# Patient Record
Sex: Female | Born: 1995 | Race: Black or African American | Hispanic: No | Marital: Single | Smoking: Former smoker
Health system: Southern US, Community
[De-identification: ages and names within clinical notes are randomized; demographics above are authoritative.]

## PROBLEM LIST (undated history)

## (undated) DIAGNOSIS — F99 Mental disorder, not otherwise specified: Secondary | ICD-10-CM

## (undated) HISTORY — PX: EYE SURGERY: SHX253

## (undated) HISTORY — DX: Mental disorder, not otherwise specified: F99

---
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
## 2014-01-03 ENCOUNTER — Ambulatory Visit: Payer: Self-pay | Admitting: Obstetrics and Gynecology

## 2014-01-05 ENCOUNTER — Encounter: Payer: Self-pay | Admitting: Obstetrics and Gynecology

## 2014-01-05 ENCOUNTER — Ambulatory Visit: Payer: Self-pay | Admitting: Obstetrics and Gynecology

## 2014-01-05 ENCOUNTER — Ambulatory Visit
Admit: 2014-01-05 | Discharge: 2014-01-05 | Disposition: A | Discharge status: Home / Self Care | Payer: Self-pay | Source: Ambulatory Visit | Attending: Obstetrics and Gynecology | Admitting: Obstetrics and Gynecology

## 2014-01-05 VITALS — BP 102/68 | HR 83 | Ht 60.75 in | Wt 135.6 lb

## 2014-01-05 DIAGNOSIS — N949 Unspecified condition associated with female genital organs and menstrual cycle: Secondary | ICD-10-CM

## 2014-01-05 DIAGNOSIS — Z01419 Encounter for gynecological examination (general) (routine) without abnormal findings: Secondary | ICD-10-CM

## 2014-01-05 MED ORDER — LEVONORGESTREL-ETHINYL ESTRAD 0.1-20 MG-MCG PO TABS *I*
1.0000 | ORAL_TABLET | Freq: Every day | ORAL | Status: DC
Start: 2014-01-05 — End: 2014-11-20

## 2014-01-05 NOTE — Patient Instructions (Signed)
Birth Control Options    About this topic   Birth control is also known as contraception. It is a way for men and women to avoid pregnancy. There are many kinds of birth control. It is important to learn about them to decide the best one for you to use.  General   Talk to your partner and your doctor. Together, you can choose the best kind of birth control for you. It is important to think about:  · Your health  · Protection from sexually-transmitted diseases (STDs)  · How often you have sex  · If you have more than one sex partner  · Your wish to have children in the future  There is only one way to fully avoid pregnancy. That is by not having sex. This is called abstinence. It also protects you from STDs.  What Is The Best Birth Control Method for Me?   Barrier methods: Use a block or barrier to keep sperm from reaching the egg. Condoms give you the most protection from STDs. Spermicides do not protect you from STDs.  · Female condom ? A thin film cover placed over the penis before sex. It is used only once.  · Female condom ? A lubricated thin pouch that is placed into the vagina before sex. It may be only used once.  · Spermicide ? A foam, cream, jelly, film, or tablet that kills sperm  · Diaphragm with spermicide ? A dome-shaped flexible disc and rim. It covers the cervix so the sperm cannot reach the egg. It is used with a spermicide.  · Sponge with spermicide ? A disc-shaped device with spermicide that covers your cervix. It is put in place before sex.  · Cervical cap with spermicide ? A soft latex or silicon cup with a round rim that fits over your cervix. It is put in place before sex.  Hormonal methods: These drugs give you hormones that keep the ovary from releasing eggs to be fertilized. This method does not protect you from STDs.  · Oral contraceptive pill ? A pill that you take each day that gives you hormones. It also thickens the cervical mucus to keep sperm from reaching the egg.  · Patch ? A  skin patch that contains hormones. It is worn on your lower belly, buttocks, or upper body.  · Vaginal ring ? A flexible ring that contains and gives you hormones. You put the ring into your vagina once a month. The ring stays in place for 3 weeks and then you remove it for 1 week.  · Shot or Injection ? A shot that gives you hormones over 3 months. It also thickens the cervical mucus that keeps the sperm from reaching the egg. You must repeat the shot every 3 months to have continued protection.  Emergency contraceptive: This may be used if you did not use birth control before sex or if your normal birth control failed. It should not be used as a regular form of birth control. This method does not protect you from STDs.  · Morning after pill ? One or two pills with hormones. This works the same as other birth control pills. You can use it after you have sex without a condom or without the use of any birth control. It is also used if your birth control did not work or you are forced to have sex.  Implanted devices: These are put into the body to stop eggs from being fertilized. They can   be kept in place for a few years. This method does not protect you from STDs.  · Intrauterine device (IUD) ? A T-shaped, flexible device that contains hormones. It is put into your uterus (womb) by the doctor.  · Implantable rod ? A matchstick-sized rod that contains hormones. It is put in under the skin of your upper arm.  Permanent methods: Surgery for people who do not want children in the future. Women will not be able to get pregnant. Men will not be able to get a woman pregnant. Permanent methods do not protect you from STDs.  · Sterilization surgery:  ¨ Women ? The fallopian tubes are closed off so the sperm cannot reach the egg. This is a tubal ligation.  ¨ Men ? The tube that carries the sperm is blocked. The fluid that comes out during ejaculation does not have sperm in it. This is a vasectomy.  · Sterilization implant for  women ? A small flexible metal device is put in the fallopian tube. A scar forms around it. This blocks the sperm from reaching the egg.  What will the results be?   Pregnancy and sexually transmitted diseases or STDs may be avoided. The option you choose will only work if you use it the right way and use it each time you have sex.  Where can I learn more?   Association of Reproductive Health Professionals  http://www.arhp.org/methodmatch/   FamilyDoctor.org  http://familydoctor.org/familydoctor/en/prevention-wellness/sex-birth-control/birth-control/birth-control-options.html   FDA Office of Women's Health  http://www.fda.gov/ForConsumers/ByAudience/ForWomen/WomensHealthTopics/ucm117971.htm   Last Reviewed Date   2013-09-27  Consumer Information Use and Disclaimer   This information is not specific medical advice and does not replace information you receive from your health care provider. This is only a brief summary of general information. It does NOT include all information about conditions, illnesses, injuries, tests, procedures, treatments, therapies, discharge instructions or life-style choices that may apply to you. You must talk with your health care provider for complete information about your health and treatment options. This information should not be used to decide whether or not to accept your health care provider’s advice, instructions or recommendations. Only your health care provider has the knowledge and training to provide advice that is right for you.  Copyright   Copyright © 2015 [employer] and its affiliates and/or licensors. All rights reserved.

## 2014-01-05 NOTE — Progress Notes (Signed)
Subjective:      Alison Robinson is a 18 y.o. premenopausal female who presents for an annual exam. The patient has no complaints today. She reports that her dysmenorrhea resolved after beginning regular exercises.     GYN History  LMP: 12/22/2013  Menses:flow is moderate  Cramps: Mild  Menarche: 18yo: [place]: 18yo   Last pap smear: Date: never  History of abnormal pap smear: N/A.   The patient is sexually active.   Sexually active: has sex with males, 3 total        Obstetric History     No data available       Screening History:    The patient wears seatbelts:yes  The patient participates in regular exercise: yes  Has the patient ever been transfused or tattooed?:yes  Lives with mom  Domestic violence:No    Alison Robinson  has a past medical history of Mental disorder.  Alison Robinson  does not have a problem list on file.  Alison Robinson  has no past surgical history on file.  Her family history is not on file.  Alison Robinson   Robinson currently has no medications in their medication list.  No current outpatient prescriptions on file prior to visit.     No current facility-administered medications on file prior to visit.     Alison Robinson has No Known Allergies (drug, envir, food or latex).  Review of Systems  Pertinent items are noted in HPI.     Objective:      Physical Examination  Constitutional:    Vital Signs: Height: 154.3 cm (5' 0.75") Weight: 61.508 kg (135 lb 9.6 oz) BP: 102/68 mmHg Heart Rate: 83     General Appearance:         [x]  Well-developed []  Other         []  Weight change from last year:_______Discussion_____         [x]  Well -nourished []  Other         []  Height  change from last year:_______Discussion_____          [x]  Normal Habitus []  Other         NECK:         Neck  [x]  Normal  []  Abnormal         Thyroid  [x]  Normal  []  Abnormal    Respiratory:         Respiratory Effort [x]  Normal  []  Abnormal         Ascultated Lungs [x]  Normal  []  Abnormal    Cardiovascular:         Ascultated Heart [x]  Normal  []   Abnormal           Sounds  [x]  Normal  []  Abnormal           Murmurs  [x]  Normal  []  Abnormal           Sounds  [x]  Normal  []  Abnormal           Peripheral Vascular [x]  Normal  []  Abnormal  Gastrointestinal:         Abdomen  [x]  Normal  []  Abnormal           Hernia  [x]  Normal  []  Abnormal           Liver  [x]  Normal  []  Abnormal           Spleen  [x]  Normal  []  Abnormal            Lymphatic:  Palpation of nodes:         --Neck  [x]  Normal  []  Abnormal           --Axilla  [x]  Normal  []  Abnormal           --Grion  [x]  Normal  []  Abnormal           --Other Site  [x]  Normal  []  Abnormal    Skin:         Inspected/Palpated [x]  Normal  []  Abnormal          Neurological/Psychiatric:         Orientation  [x]  Time      [x]  Place [x]  Person  []  Comments:            Mood and Affect [x]  Normal  []  Depressed  []  Anxious  []  Agitated []  Other  Gynecologic:         Breasts   [x]  Normal  []  Abnormal           External Genitalia  [x]  Normal  []  Abnormal           Urethral Meatus  [x]  Normal  []  Abnormal           Urethera   [x]  Normal  []  Abnormal           Bladder   [x]  Normal  []  Abnormal         Vagina/Pelvic Support  [x]  Normal  []  Abnormal           Cervix                   [x]  Normal  []  Abnormal           Uterus   [x]  Normal  []  Abnormal  Displaced to the far left of the pelvis.          Adnexa/Parametria  [x]  Normal  []  Abnormal  No obvious masses, tenderness or reasons for uterine displacement.          Anus/Perineum  [x]  Normal  []  Abnormal         Rectal   [x]  Normal  []  Abnormal           Uterine Prolapse  [x]  Normal  []  Abnormal         Cystocele   [x]  Normal  []  Abnormal         Rectocele   [x]  Normal  []  Abnormal       Assessment:      Alison Robinson is a 18 y.o. female who presents for an annual exam.     She has the following concerns: 1) STI TESTING      Plan:       Preventative health: Breast self exam technique reviewed and patient encouraged to perform self-exam monthly., Nutrition., Calcium.  and Multivitamins.  Nutrition/Exercise reviewed: Counseled on regular exercise. Discussed duration, frequency, intensity, and types of exercise.     STI Panel

## 2014-01-05 NOTE — Addendum Note (Signed)
Addended by: Candida PeelingPRATIVADI, Jamiah Homeyer S on: 01/05/2014 11:25 AM     Modules accepted: Orders

## 2014-01-05 NOTE — Addendum Note (Signed)
Addended by: Delories HeinzSALADYGA, Noorah Giammona on: 01/05/2014 11:27 AM     Modules accepted: Orders

## 2014-01-06 LAB — HEPATITIS B & C PROF
HBV Core Ab: NEGATIVE
HBV S Ab Quant: 8.28 m[IU]/mL
HBV S Ag: NEGATIVE
Hep C Ab: NEGATIVE

## 2014-01-06 LAB — SYPHILIS SCREEN
Syphilis Screen: NEGATIVE
Syphilis Status: NONREACTIVE

## 2014-01-06 LAB — VAGINITIS SCREEN: DNA PROBE: Vaginitis Screen:DNA Probe: 0

## 2014-01-06 LAB — HIV 1&2 ANTIGEN/ANTIBODY: HIV 1&2 ANTIGEN/ANTIBODY: NONREACTIVE

## 2014-01-08 LAB — N. GONORRHOEAE DNA AMPLIFICATION: N. gonorrhoeae DNA Amplification: 0

## 2014-01-08 LAB — CHLAMYDIA PLASMID DNA AMPLIFICATION: Chlamydia Plasmid DNA Amplification: 0

## 2014-01-09 LAB — HSV 1 ANTIBODY, IGG: HSV 1 IgG: 2.01 IV

## 2014-01-09 LAB — HSV 2 ANTIBODY, IGG: HSV 2 IgG: 0.16 IV

## 2014-04-23 ENCOUNTER — Encounter: Payer: Self-pay | Admitting: Obstetrics and Gynecology

## 2014-04-23 DIAGNOSIS — B009 Herpesviral infection, unspecified: Secondary | ICD-10-CM | POA: Insufficient documentation

## 2014-04-23 DIAGNOSIS — Z1159 Encounter for screening for other viral diseases: Secondary | ICD-10-CM | POA: Insufficient documentation

## 2014-11-15 ENCOUNTER — Telehealth: Payer: Self-pay

## 2014-11-15 NOTE — Telephone Encounter (Signed)
Patient called office complaining of increased amounts of white discharge over the last 2 months. Describes it as odorous. Occurs only when she wipes but states there "is a lot of it". Denies any abnormal vaginal bleeding. No abdominal pain. Appointment scheduled for 11/20/14. Patient agreeable to plan. Will call office with any other questions or concerns.    Markus JarvisPERICAK, Cadey Bazile, RN

## 2014-11-20 ENCOUNTER — Ambulatory Visit: Payer: Self-pay | Admitting: Obstetrics and Gynecology

## 2014-11-20 ENCOUNTER — Encounter: Payer: Self-pay | Admitting: Obstetrics and Gynecology

## 2014-11-20 VITALS — BP 102/70 | HR 84 | Temp 98.0°F | Ht 60.75 in | Wt 134.8 lb

## 2014-11-20 DIAGNOSIS — Z1159 Encounter for screening for other viral diseases: Secondary | ICD-10-CM

## 2014-11-20 DIAGNOSIS — Z309 Encounter for contraceptive management, unspecified: Secondary | ICD-10-CM | POA: Insufficient documentation

## 2014-11-20 DIAGNOSIS — N898 Other specified noninflammatory disorders of vagina: Secondary | ICD-10-CM

## 2014-11-20 DIAGNOSIS — Z3041 Encounter for surveillance of contraceptive pills: Secondary | ICD-10-CM

## 2014-11-20 DIAGNOSIS — B009 Herpesviral infection, unspecified: Secondary | ICD-10-CM

## 2014-11-20 DIAGNOSIS — Z113 Encounter for screening for infections with a predominantly sexual mode of transmission: Secondary | ICD-10-CM

## 2014-11-20 MED ORDER — LEVONORGESTREL-ETHINYL ESTRAD 0.1-20 MG-MCG PO TABS *I*
1.0000 | ORAL_TABLET | Freq: Every day | ORAL | 3 refills | Status: DC
Start: 2014-11-20 — End: 2018-04-21

## 2014-11-20 NOTE — Progress Notes (Signed)
GYN Visit    CC: discharge with odor    S: Alison Robinson is a 19 y.o. No obstetric history on file. who presents with concern of partner infidelity, desire for STD testing and c/o intermittent h/o thick, white vaginal discharge with occasional "fishy" odor for the past 2 months. She denies vaginal itching, fever, chills, dysuria, other s/s of UTI, malaise, pelvic pain, dyspareunia, recent antibiotics or soap changes. She is in a 2 year relationship and reports condoms 100% of the time. 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 Received Gardasil. Also denies recent antibiotics or excess sweating, high sugar intake. Never started COCPs as ordered last year but will today (+ menses currently)      ROS: as per HPI    Patient's medications, allergies, past medical, surgical, social and family histories were reviewed and updated as appropriate.    GYN Hx:   Patient's last menstrual period was 11/16/2014.  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 NO CMT, no adnexal masses or tenderness. No uterine tenderness. No lesions    Wet Prep: deferred d/t +menses      A: 19 y.o. female No obstetric history on file. presenting to the office with:  1. Vaginal discharge  Chlamydia plasmid DNA amplification    Hepatitis B surface antigen    Hepatitis C antibody    HIV 1&2 antigen/antibody    N. Gonorrhoeae DNA amplification    Syphilis Screen w Rfx to RPR  and TPPA    Vaginitis screen: DNA probe    Chlamydia plasmid DNA amplification    N. Gonorrhoeae DNA amplification    Vaginitis screen: DNA probe   2. Vaginal odor  Chlamydia plasmid DNA amplification    Hepatitis B surface antigen    Hepatitis C antibody    HIV 1&2 antigen/antibody    N. Gonorrhoeae DNA amplification    Syphilis Screen w Rfx to RPR and TPPA    Vaginitis screen: DNA probe    Chlamydia plasmid DNA amplification    N. Gonorrhoeae DNA amplification    Vaginitis screen: DNA probe   3. PCR DNA positive for HSV1  Chlamydia plasmid DNA amplification    Hepatitis B surface antigen    Hepatitis C antibody    HIV 1&2 antigen/antibody    N. Gonorrhoeae DNA amplification    Syphilis Screen w Rfx to RPR and TPPA    Vaginitis screen: DNA probe    Chlamydia plasmid DNA amplification    N. Gonorrhoeae DNA amplification    Vaginitis screen: DNA probe   4. Screening for STD (sexually transmitted disease)  Chlamydia plasmid DNA amplification    Hepatitis B surface antigen    Hepatitis C antibody    HIV 1&2 antigen/antibody    N. Gonorrhoeae DNA amplification  Syphilis Screen w Rfx to RPR and TPPA    Vaginitis screen: DNA probe    Chlamydia plasmid DNA amplification    N. Gonorrhoeae DNA amplification    Vaginitis screen: DNA probe   5. Encounter for surveillance of contraceptive pills            P:  1-4. Checking GC, Chlamydia, Vaginitis cultures. Checking HIV, Hep B, Hep C and RPR- will treat PRN. Safe sex reinforced. Annual f/u advised (due late August)  5. Will begin Aviane COCPs today- Daily MVI with Folic acid and back-up condom advised,

## 2014-11-20 NOTE — Patient Instructions (Addendum)
Switch to [employer] for genital area washing. Take probiotics and a Multivitamin with folic acid daily. May start OCPs today and use back up condoms consistently.        Bacterial Vaginosis    The Basics   Written by the doctors and editors at UpToDate   What is bacterial vaginosis?--Bacterial vaginosis is an infection in the vagina that can cause bad-smelling vaginal discharge. Vaginal discharge is the term doctors and nurses use to describe any fluid that comes out of the vagina (figure 1). Normally, women have a small amount of vaginal discharge each day. But women with bacterial vaginosis can have a lot of vaginal discharge, or vaginal discharge that smells bad.  Bacterial vaginosis is caused by certain bacteria (germs). The vagina normally has different types of bacteria in it. When the amounts or the types of bacteria change, an infection can happen.   Women do not catch bacterial vaginosis from having sex. But women who have bacterial vaginosis have a higher chance of catching other infections from their partner during sex.  What are the symptoms of bacterial vaginosis?--Most women with bacterial vaginosis have no symptoms. When women have symptoms, they often have a fishy-smelling vaginal discharge that they might notice more after sex. The discharge is watery and off-white or gray.   Is there a test for bacterial vaginosis?--Yes. Your doctor or nurse will do an exam. He or she will also take a sample of your vaginal discharge, and do lab tests on the sample to look for an infection.  How is bacterial vaginosis treated?--Bacterial vaginosis is treated with medicine. Two different medicines can be used. They are called:   Metronidazole   Clindamycin  Both of these medicines come in different forms. They can come as a pill or as a gel or cream that a woman puts inside her vagina. Most women have fewer side effects when they use the gel or cream treatment. But you and your doctor or nurse will  decide which medicine and which form is right for you.   It is important that you take all of the medicine your doctor or nurse prescribes, even if your symptoms go away after a few doses. Taking all of your medicine can help prevent the symptoms from coming back.  Does my sex partner need to be treated if I have bacterial vaginosis?--No. Your sex partner does not need to be treated if you have bacterial vaginosis.  What happens if my symptoms come back?--If your symptoms come back, let your doctor or nurse know. You might need treatment with more medicine.   Some women get bacterial vaginosis over and over again. These women might take medicine for 3 to 6 months to try to prevent future infections.  What if I am pregnant and have symptoms of bacterial vaginosis?--If you are pregnant and have symptoms of bacterial vaginosis, tell your doctor or nurse. You might need treatment with medicine.   Can bacterial vaginosis be prevented?--Sometimes. You can help prevent bacterial vaginosis by:   Not douching (douching is when a woman puts a liquid inside her vagina to rinse it out)   Not having a lot of sex partners   Not smoking  All topics are updated as new evidence becomes available and our peer review process is complete.  This topic retrieved from UpToDate on: Sep 06, 2013.  Topic 15592 Version 4.0  Release: 23.3 - C23.74  2015UpToDate, Inc.All rights reserved.  figure 1: Adult female external genitalia  This drawing shows the parts of a woman's genitals.  Graphic [id number] Version 7.0  Consumer Information Use and Disclaimer   This information is not specific medical advice and does not replace information you receive from your health care provider. This is only a brief summary of general information. It does NOT include all information about conditions, illnesses, injuries, tests, procedures, treatments, therapies, discharge instructions or life-style choices that may apply to you. You must talk with  your health care provider for complete information about your health and treatment options. This information should not be used to decide whether or not to accept your health care provider's advice, instructions or recommendations. Only your health care provider has the knowledge and training to provide advice that is right for you.The use of UpToDate content is governed by the UpToDate Terms of Use. 2015 [employer]Date, Inc. All rights reserved.  Copyright   2015[employer]Date, Inc.All rights reserved.

## 2014-11-21 ENCOUNTER — Telehealth: Payer: Self-pay | Admitting: Obstetrics and Gynecology

## 2014-11-21 LAB — CHLAMYDIA PLASMID DNA AMPLIFICATION: Chlamydia Plasmid DNA Amplification: 0

## 2014-11-21 LAB — N. GONORRHOEAE DNA AMPLIFICATION: N. gonorrhoeae DNA Amplification: 0

## 2014-11-21 LAB — VAGINITIS SCREEN: DNA PROBE: Vaginitis Screen:DNA Probe: POSITIVE

## 2014-11-21 MED ORDER — METRONIDAZOLE 500 MG PO TABS *I*
500.0000 mg | ORAL_TABLET | Freq: Two times a day (BID) | ORAL | 0 refills | Status: AC
Start: 2014-11-21 — End: 2014-11-28

## 2014-11-21 NOTE — Telephone Encounter (Signed)
Phone call made to patient's home number and mother, [name], answered. Alison Robinson is an approved person to speak with per patient's FYI sheet. Informed of +BV on culture and the medication sent to her pharmacy. Mother expresses understanding, states she believed this to be the issue. Brings no further questions or concerns when asked. Will pick up medication today.    Alison Robinson, Alison Gunnoe, RN

## 2014-11-21 NOTE — Telephone Encounter (Signed)
RN to call patient- +Gardnerella on Vaginitis culture. Will treat with Flagyl 500 mg PO bid x 7. Order sent to Pharmacy of record.

## 2014-11-23 ENCOUNTER — Telehealth: Payer: Self-pay

## 2014-11-23 NOTE — Telephone Encounter (Signed)
Patient's mother, Christian MateKusanya, called office. Christian MateKusanya is approved to speak with per patient's FYI sheet. Inquires [facility] [address]     [facility] [address] Verbal order given for Flagyl Rx. No questions or concerns brought forth regarding plan of care.    Markus JarvisPERICAK, Brayden Betters, RN

## 2017-02-18 ENCOUNTER — Encounter: Payer: Self-pay | Admitting: Pediatrics

## 2017-08-01 ENCOUNTER — Ambulatory Visit
Admission: AD | Admit: 2017-08-01 | Discharge: 2017-08-01 | Disposition: A | Discharge status: Home / Self Care | Payer: BLUE CROSS/BLUE SHIELD | Source: Ambulatory Visit | Attending: Emergency Medicine | Admitting: Emergency Medicine

## 2017-08-01 DIAGNOSIS — R590 Localized enlarged lymph nodes: Secondary | ICD-10-CM

## 2017-08-01 DIAGNOSIS — J029 Acute pharyngitis, unspecified: Secondary | ICD-10-CM

## 2017-08-01 LAB — POCT AMBULATORY RAPID STREP
Lot #: 181327
Rapid Strep Group A Throat-POC: NEGATIVE

## 2017-08-01 LAB — POCT MONO
Lot #: 181297
MONO, POC: NEGATIVE

## 2017-08-01 NOTE — ED Notes (Signed)
Discharge instructions reviewed. Patient verbalized understanding and all questions answered. All belongings accounted for and taken with pt upon d/c.

## 2017-08-01 NOTE — Discharge Instructions (Signed)
Gargle frequently with warm salt water (1/4 teaspoon salt per 8 oz water.)  May dilute a throat-numbing spray in water, gargle and spit, as needed.    Thank you Verda CuminsBrittany Spitzley for coming to [facility] for your health care concerns.    If your condition changes and/or worsens please follow up with your primary doctor and/or return to the urgent care center.    If short of breath, chest pains or any other concerns please report to the emergency room.    In the event of an Emergency dial 911.

## 2017-08-01 NOTE — UC Provider Note (Signed)
History     Chief Complaint   Patient presents with    Sore Throat     Pt has a sore throat that started three weeks ago. She is concerned that it is strep. She states she now has a cough     Three weeks intermittent moderate sore throat, now accompanied by cough and mild nasal discharge. No constitutional or GI symptoms. No specific exposures known. She has not had mononucleosis.        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 No pertinent surgical history.  History reviewed. No pertinent family history.       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   HENT: Positive for rhinorrhea. Negative for congestion and sore throat.    Eyes: Negative for visual disturbance.   Respiratory: Positive for cough.    Cardiovascular: Negative for chest pain.   Gastrointestinal: Negative for diarrhea and vomiting.   Musculoskeletal: Negative for gait problem.   Skin: Negative for rash.   Neurological: Negative for dizziness.   Hematological: Positive for adenopathy.       Physical Exam   Triage Vitals  Triage Start: Start, (08/01/17 2100)   First Recorded BP: 121/58, Resp: 17, Temp: 36.9 C (98.4 F), Temp src: TEMPORAL Oxygen Therapy SpO2: 99 %, O2 Device: None (Room air), Heart Rate: 69, (08/01/17 2102)  .      Physical Exam   Constitutional: She is oriented to person, place, and time.  Vital signs are normal. She appears well-developed. No distress.   HENT:   Head: Normocephalic.   Eyes: Conjunctivae are normal. No scleral icterus.   Neck: Neck supple.   Cardiovascular: Normal rate, regular rhythm, normal heart sounds and intact distal pulses.    Pulmonary/Chest: Effort normal and breath sounds normal.   Musculoskeletal:   Gait normal   Lymphadenopathy:     She has cervical adenopathy (slightly enlarged, slightly tender high posterior).   Neurological: She is alert and oriented to person, place, and time. No cranial nerve deficit (Face symmetric, speech clear.). Gait normal.   Skin: Skin is warm and dry.   Psychiatric: She has a normal mood and affect. Her behavior is normal.   Nursing note and vitals reviewed.       Medical Decision Making        Initial Evaluation:  ED First Provider Contact     Date/Time Event User Comments    08/01/17 2057 ED First Provider Contact Jhalen Eley,  Trenae Brunke Initial Face to Face Provider Contact          Patient was seen on: 08/01/2017        Assessment:  22 y.o.female comes to the [facility] with sore throat    Differential Diagnosis includes:  strep pharyngitis, viral pharyngitis, allergic pharyngitis, mono, GC, thrush, trauma      Plan: Rapid strep is negative. Monospot is negative. Throat culture is pending. Mechanical and topical methods of symptom relief. Advised to proceed to the Emergency Department in case of any danger signs (reviewed).       Final Diagnosis  Final diagnoses:   [J02.9] Pharyngitis, unspecified etiology (Primary)     Orders Placed This Encounter    Strep A culture, throat    POCT rapid strep    POCT mono       Recent Results (from the past 24 hour(s))   POCT rapid strep    Collection Time: 08/01/17  9:10 PM   Result Value Ref Range    Rapid Strep Group A Throat-POC Negative for Streptococcus Group A Antigen Negative    INTERNAL CONTROL RAPID STREP POCT *Yes-internal procedural control(s) acceptable     Exp date 1/20     Lot # [id number]     POCT mono    Collection Time: 08/01/17  9:34 PM   Result Value Ref Range    MONO, POC Negative Negative    INTERNAL CONTROL POCT MONO *Yes-internal procedural control(s) acceptable     Expiration Date 11/2017     Lot # [id number]          Final Diagnosis    ICD-10-CM ICD-9-CM   1. Pharyngitis, unspecified etiology J02.9 462       Rest, hydration, and good hand hygiene recommended.  Symptom relief, warning signs and infection control reviewed.  Use of over the counter and already prescribed medications discussed.    Please follow up with your physician as below:  Follow-up Information     Imiolek, Manual MeierPiotr Zbigniew, MD.    Specialty:  Internal Medicine  Why:  As needed  Contact information:  [address]  [address]   [place] [id number]  [phone]           Gargle frequently with warm salt water (1/4 teaspoon salt per 8 oz water.)  May dilute a throat-numbing spray in water, gargle and spit, as needed.    Thank you Verda CuminsBrittany Meyn for coming to [facility] for your health care concerns.    If your condition changes and/or worsens please follow up with your primary doctor and/or return to the urgent care center.    If short of breath, chest pains or any other concerns please report to the emergency room.    In the event of an Emergency dial 911.          Thank you Verda CuminsBrittany Schoppe for coming to [facility] for your health care concerns.  If your condition changes and/or worsens please follow up with your primary doctor and/or return to the urgent care center.  If short of breath, chest pains or any other concerns please report to the emergency room.    In the event of an Emergency dial 911.      Dorna BloomSUZANNE Aigner Horseman, MD              Dorna BloomBrendze, Ettamae Barkett, MD  08/04/17 [phone]

## 2017-08-01 NOTE — ED Triage Notes (Signed)
Pt has a sore throat that started three weeks ago. She is concerned that it is strep. She states she now has a cough    Triage Note   Joellen JerseyKelly Clancy Mullarkey, RN

## 2017-08-04 ENCOUNTER — Encounter: Payer: Self-pay | Admitting: Emergency Medicine

## 2017-08-04 LAB — STREP A CULTURE, THROAT: Group A Strep Throat Culture: 0

## 2017-11-11 ENCOUNTER — Ambulatory Visit: Payer: BLUE CROSS/BLUE SHIELD | Admitting: Obstetrics and Gynecology

## 2018-04-21 ENCOUNTER — Ambulatory Visit
Admission: AD | Admit: 2018-04-21 | Discharge: 2018-04-21 | Disposition: A | Discharge status: Home / Self Care | Payer: BLUE CROSS/BLUE SHIELD | Source: Ambulatory Visit | Attending: Emergency Medicine | Admitting: Emergency Medicine

## 2018-04-21 DIAGNOSIS — Z113 Encounter for screening for infections with a predominantly sexual mode of transmission: Secondary | ICD-10-CM

## 2018-04-21 DIAGNOSIS — N760 Acute vaginitis: Secondary | ICD-10-CM

## 2018-04-21 LAB — VAGINITIS SCREEN: DNA PROBE: Vaginitis Screen:DNA Probe: POSITIVE — AB

## 2018-04-21 MED ORDER — METRONIDAZOLE 500 MG PO TABS *I*
500.0000 mg | ORAL_TABLET | Freq: Two times a day (BID) | ORAL | 0 refills | Status: AC
Start: 2018-04-21 — End: 2018-04-28

## 2018-04-21 NOTE — UC Provider Note (Signed)
History     Chief Complaint   Patient presents with    Vaginal Discharge     Pt. reports to UC with c/o vaginal discharge and foul odor; Pt. states she would additionally liked to be checked for STD's; Denies any fever or chills; Reports she doe shave a history of BV;      2 days increasing volume of malodorous vaginal discharge, similar to last episode of bacterial vaginosis, which she has had occasionally. She does not wish to have blood tests today. No known or rumored STI exposure. Single exclusive partner. Answers up-to-date with routine gynecological care.  Mentioned to nurse that she never began her OCP. No missed periods or other symptoms of pregnancy. No urinary symptoms.       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 No pertinent surgical history.  No family history on file.       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 Negative for fever.   HENT: Negative for sore throat.    Eyes: Negative for discharge.   Respiratory: Negative for shortness of breath.    Cardiovascular: Negative for chest pain.   Gastrointestinal: Negative for abdominal pain, diarrhea and vomiting.   Genitourinary: Positive for vaginal discharge. Negative for difficulty urinating,  dysuria, frequency, genital sores, hematuria, pelvic pain, urgency, vaginal bleeding and vaginal pain.   Musculoskeletal: Negative for arthralgias.   Skin: Negative for rash.   Neurological: Negative for dizziness.   Hematological: Negative for adenopathy.       Physical Exam   Triage Vitals  Triage Start: Start, (04/21/18 1235)   First Recorded BP: 114/60, Resp: 18, Temp: 36.7 C (98.1 F), Temp src: TEMPORAL Oxygen Therapy SpO2: 98 %, Oximetry Source: Rt Hand, O2 Device: None (Room air), Heart Rate: 71, (04/21/18 1237)  .  First Pain Reported  0-10 Scale: 0, (04/21/18 1237)       Physical Exam  Vitals signs and nursing note reviewed.   Constitutional:       General: She is not in acute distress.  HENT:      Head: Normocephalic.   Eyes:      General: No scleral icterus.  Neck:      Musculoskeletal: Normal range of motion.   Cardiovascular:      Rate  and Rhythm: Normal rate.   Pulmonary:      Effort: Pulmonary effort is normal.   Musculoskeletal:      Comments: Gait and spontaneous movements are normal.   Skin:     General: Skin is warm.   Neurological:      Mental Status: She is alert and oriented to person, place, and time.      Cranial Nerves: No cranial nerve deficit (Face symmetric, speech clear.).   Psychiatric:         Behavior: Behavior normal.          Medical Decision Making        Initial Evaluation:  ED First Provider Contact     Date/Time Event User Comments    04/21/18 1236 ED First Provider Contact Dorna Bloom Initial Face to Face Provider Contact          Patient was seen on: 04/21/2018        Assessment:  22 y.o.female comes to the Urgent Care Center with malodorous vaginal discharge    Differential Diagnosis includes:  bacterial vaginosis, vaginal candidiasis, vaginal trichomonas, gonorrhea, chlamydia, physiologic      Plan: She wishes to begin metronidazole today before test results are back. Possible medication side effects reviewed.  Advised to pursue testing for syphilis, HIV and hepatitis  with her gynecologist.        Final Diagnosis  Final diagnoses:   [N76.0] Acute vaginitis (Primary)     Orders Placed This Encounter    Vaginitis screen: DNA probe    Chlamydia plasmid DNA amplification    N. Gonorrhoeae DNA amplification    metroNIDAZOLE (FLAGYL) 500 MG tablet       No results found for this or any previous visit (from the past 24 hour(s)).      Final Diagnosis    ICD-10-CM ICD-9-CM   1. Acute vaginitis N76.0 616.10       Rest, hydration, and good hand hygiene recommended.  Symptom relief, warning signs and infection control reviewed.  Use of over the counter and already prescribed medications discussed.    Please start the new medications as below:  Current Discharge Medication List      New Medications    Details Last Dose Given Next Dose Due Script Given?   metroNIDAZOLE (FLAGYL) 500 mg Dose: 500 mg  Take 500 mg by mouth 2 times daily Avoid alcohol.  Quantity 14 tablet, Refill 0  Start date: 04/21/2018, End date: 04/28/2018                   Please follow up with your physician as below:  Follow-up Information     Imiolek, Manual Meier, MD.    Specialty:  Internal Medicine  Why:  As needed  Contact information:  [address]  Laurell Josephs [address] [place] [address]  [phone]        Discharge Instructions         Please include yogurt or a probiotic in your diet while taking antibiotic. Separate them by at least 2 hours.    Thank you Verda Cumins for coming to [facility] for your health care concerns.    If your condition changes and/or worsens please follow up with your primary doctor and/or return to the urgent care center.    If short of breath, chest pains or any other concerns please report to the emergency room.    In the event of an Emergency dial 911.    Ruari Duggan  Blanch Media, MD              Dorna Bloom, MD  04/21/18 [phone]

## 2018-04-21 NOTE — ED Triage Notes (Signed)
Pt. reports to UC with c/o vaginal discharge and foul odor; Pt. states she would additionally liked to be checked for STD's; Denies any fever or chills; Reports she doe shave a history of BV;        Triage Note   Gary Fleetourtney Myla Mauriello, RN

## 2018-04-21 NOTE — Discharge Instructions (Addendum)
Please include yogurt or a probiotic in your diet while taking antibiotic. Separate them by at least 2 hours.    Thank you Verda CuminsBrittany Lagerstrom for coming to [facility] for your health care concerns.    If your condition changes and/or worsens please follow up with your primary doctor and/or return to the urgent care center.    If short of breath, chest pains or any other concerns please report to the emergency room.    In the event of an Emergency dial 911.

## 2018-04-22 LAB — CHLAMYDIA PLASMID DNA AMPLIFICATION: Chlamydia Plasmid DNA Amplification: 0

## 2018-04-22 LAB — N. GONORRHOEAE DNA AMPLIFICATION: N. gonorrhoeae DNA Amplification: 0

## 2019-10-14 ENCOUNTER — Emergency Department (HOSPITAL_COMMUNITY)
Admission: EM | Admit: 2019-10-14 | Discharge: 2019-10-14 | Disposition: A | Discharge status: Home / Self Care | Payer: 59 | Attending: Emergency Medicine | Admitting: Emergency Medicine

## 2019-10-14 ENCOUNTER — Other Ambulatory Visit: Payer: Self-pay

## 2019-10-14 DIAGNOSIS — L509 Urticaria, unspecified: Secondary | ICD-10-CM | POA: Diagnosis not present

## 2019-10-14 DIAGNOSIS — R0989 Other specified symptoms and signs involving the circulatory and respiratory systems: Secondary | ICD-10-CM | POA: Diagnosis present

## 2019-10-14 MED ORDER — PREDNISONE 10 MG PO TABS
50.0000 mg | ORAL_TABLET | Freq: Every day | ORAL | 0 refills | Status: DC
Start: 2019-10-14 — End: 2020-11-19

## 2019-10-14 MED ORDER — DIPHENHYDRAMINE HCL 25 MG PO CAPS
25.0000 mg | ORAL_CAPSULE | Freq: Four times a day (QID) | ORAL | 0 refills | Status: DC | PRN
Start: 2019-10-14 — End: 2020-11-19

## 2019-10-14 MED ORDER — EPINEPHRINE 0.3 MG/0.3ML IJ SOAJ: 0.3000 mg | INTRAMUSCULAR | 1 refills | Status: DC | PRN

## 2019-10-14 MED ORDER — PREDNISONE 20 MG PO TABS
60.0000 mg | ORAL_TABLET | Freq: Once | ORAL | Status: AC
  Administered 2019-10-14: 60 mg via ORAL
  Filled 2019-10-14: qty 3

## 2019-10-14 NOTE — ED Triage Notes (Signed)
Patient reports having an allergic reaction a couple of days ago. Says last night her throat felt as if it was closing. Patient took a benadryl and felt better but says feeling returned today. Patient took benadryl again and now feels better. Patient states she is unsure if it was a reaction or anxiety.

## 2019-10-14 NOTE — Discharge Instructions (Addendum)
We saw you in the ER after you had the allergic reaction.  The reaction isn't appearing to be severe, however, there are concerns that it could get worse. We are not sure what caused the reaction (allergy or autoimmune condition etc.), and it is important for you to follow up with an allergist. Please take the medications prescribed. PLEASE RETURN TO THE ER IMMEDIATELY IN CASE YOU START HAVING WORSENING SWELLING, DIFFICULTY IN BREATHING ETC.

## 2019-10-23 NOTE — ED Provider Notes (Signed)
[facility] DEPT Provider Note   CSN: [id number] Arrival date & time: 10/14/19  1717     History Chief Complaint  Patient presents with  . Allergic Reaction    Olivia Wagner is a 24 y.o. female.  HPI    24 year old female comes in a chief complaint of allergic reaction.  Patient reports that she started feeling that her throat was closing, and decided to come to the ER.  Patient has had history of urticarial rash that intermittently present themselves.  Occasionally she has also had swelling in her throat.  With this episode, 2 days ago she started noticing a rash that has progressed followed by the throat closing sensation.  She has taken Benadryl without significant relief.  She used to follow-up with a specialist for management of this condition, but has been lost to follow-up.  It is unclear why she is having these symptoms.  No past medical history on file.  There are no problems to display for this patient.     OB History   No obstetric history on file.     No family history on file.  Social History   Tobacco Use  . Smoking status: Not on file  Substance Use Topics  . Alcohol use: Not on file  . Drug use: Not on file    Home Medications Prior to Admission medications   Medication Sig Start Date End Date Taking? Authorizing Provider  diphenhydrAMINE (BENADRYL) 25 mg capsule Take 1 capsule (25 mg total) by mouth every 6 (six) hours as needed for itching. 10/14/19   Varney Biles, MD  EPINEPHrine 0.3 mg/0.3 mL IJ SOAJ injection Inject 0.3 mLs (0.3 mg total) into the muscle as needed for anaphylaxis. 10/14/19   Varney Biles, MD  predniSONE (DELTASONE) 10 MG tablet Take 5 tablets (50 mg total) by mouth daily. 10/14/19   Varney Biles, MD    Allergies    Shellfish allergy  Review of Systems   Review of Systems  Constitutional: Positive for activity change.  Respiratory: Positive for shortness of breath.     Cardiovascular: Negative for chest pain.  Skin: Positive for rash.  All other systems reviewed and are negative.   Physical Exam Updated Vital Signs BP 109/70 (BP Location: Left Arm)   Pulse 75   Temp 98.5 F (36.9 C) (Oral)   Resp 15   Ht 5' (1.524 m)   Wt 72.1 kg   LMP 09/30/2019 (Exact Date)   SpO2 100%   BMI 31.05 kg/m   Physical Exam Vitals and nursing note reviewed.  Constitutional:      Appearance: She is well-developed.  HENT:     Head: Normocephalic and atraumatic.     Mouth/Throat:     Mouth: Mucous membranes are moist.     Pharynx: Oropharynx is clear.  Cardiovascular:     Rate and Rhythm: Normal rate.  Pulmonary:     Effort: Pulmonary effort is normal.     Breath sounds: No stridor. No wheezing.  Abdominal:     General: Bowel sounds are normal.  Musculoskeletal:     Cervical back: Normal range of motion and neck supple.  Skin:    General: Skin is warm and dry.     Findings: Rash present. No bruising.  Neurological:     Mental Status: She is alert and oriented to person, place, and time.     ED Results / Procedures / Treatments   Labs (all labs ordered are listed, but only  abnormal results are displayed) Labs Reviewed - No data to display  EKG None  Radiology No results found.  Procedures Procedures (including critical care time)  Medications Ordered in ED Medications  predniSONE (DELTASONE) tablet 60 mg (60 mg Oral Given 10/14/19 2029)    ED Course  I have reviewed the triage vital signs and the nursing notes.  Pertinent labs & imaging results that were available during my care of the patient were reviewed by me and considered in my medical decision making (see chart for details).    MDM Rules/Calculators/A&P                      Patient comes in a chief complaint of difficulty breathing due to sensation that her throat is closing.  She has history of intermittent episode of urticarial rash with occasional association of throat  closing sensation with it.  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  We have observed patient in the ER for few hours and she has not had any progression of her symptoms.  She has insurance.  We have advised that she follows up with allergy clinic to get appropriate diagnosis so that she can be managed optimally.  Patient is in agreement. Strict ER return precautions have been discussed, and patient is agreeing with the plan and is comfortable with the workup done and the recommendations from the ER.   Final Clinical Impression(s) / ED Diagnoses Final diagnoses:  Urticaria    Rx / DC Orders ED Discharge Orders         Ordered    diphenhydrAMINE (BENADRYL) 25 mg capsule  Every 6 hours PRN     10/14/19 2021    predniSONE (DELTASONE) 10 MG tablet  Daily     10/14/19 2022    EPINEPHrine 0.3 mg/0.3 mL IJ SOAJ injection  As needed     10/14/19 2022           Derwood Kaplan, MD 10/23/19 1640

## 2020-09-01 ENCOUNTER — Encounter (HOSPITAL_COMMUNITY): Payer: Self-pay | Admitting: Emergency Medicine

## 2020-09-01 ENCOUNTER — Emergency Department (HOSPITAL_COMMUNITY)
Admission: EM | Admit: 2020-09-01 | Discharge: 2020-09-02 | Disposition: A | Discharge status: Home / Self Care | Payer: Medicaid Other | Attending: Emergency Medicine | Admitting: Emergency Medicine

## 2020-09-01 ENCOUNTER — Other Ambulatory Visit: Payer: Self-pay

## 2020-09-01 DIAGNOSIS — R079 Chest pain, unspecified: Secondary | ICD-10-CM | POA: Diagnosis present

## 2020-09-01 DIAGNOSIS — R7989 Other specified abnormal findings of blood chemistry: Secondary | ICD-10-CM | POA: Diagnosis not present

## 2020-09-01 DIAGNOSIS — R0789 Other chest pain: Secondary | ICD-10-CM | POA: Insufficient documentation

## 2020-09-01 DIAGNOSIS — R519 Headache, unspecified: Secondary | ICD-10-CM | POA: Insufficient documentation

## 2020-09-01 DIAGNOSIS — R1011 Right upper quadrant pain: Secondary | ICD-10-CM | POA: Diagnosis not present

## 2020-09-01 DIAGNOSIS — M25521 Pain in right elbow: Secondary | ICD-10-CM | POA: Diagnosis not present

## 2020-09-01 DIAGNOSIS — D72829 Elevated white blood cell count, unspecified: Secondary | ICD-10-CM | POA: Insufficient documentation

## 2020-09-01 DIAGNOSIS — M79661 Pain in right lower leg: Secondary | ICD-10-CM | POA: Insufficient documentation
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
## 2020-09-02 ENCOUNTER — Emergency Department (HOSPITAL_COMMUNITY): Payer: Medicaid Other

## 2020-09-02 ENCOUNTER — Encounter (HOSPITAL_COMMUNITY): Payer: Self-pay

## 2020-09-02 LAB — I-STAT BETA HCG BLOOD, ED (MC, WL, AP ONLY): I-stat hCG, quantitative: 17 m[IU]/mL — ABNORMAL HIGH (ref <5)

## 2020-09-02 LAB — CBC WITH DIFFERENTIAL/PLATELET
Abs Immature Granulocytes: 0.02 10*3/uL (ref 0.00–0.07)
Basophils Absolute: 0 10*3/uL (ref 0.0–0.1)
Basophils Relative: 0 %
Eosinophils Absolute: 0 10*3/uL (ref 0.0–0.5)
Eosinophils Relative: 0 %
HCT: 39.7 % (ref 36.0–46.0)
Hemoglobin: 13.4 g/dL (ref 12.0–15.0)
Immature Granulocytes: 0 %
Lymphocytes Relative: 12 %
Lymphs Abs: 1.2 10*3/uL (ref 0.7–4.0)
MCH: 32.8 pg (ref 26.0–34.0)
MCHC: 33.8 g/dL (ref 30.0–36.0)
MCV: 97.3 fL (ref 80.0–100.0)
Monocytes Absolute: 0.7 10*3/uL (ref 0.1–1.0)
Monocytes Relative: 7 %
Neutro Abs: 8.6 10*3/uL — ABNORMAL HIGH (ref 1.7–7.7)
Neutrophils Relative %: 81 %
Platelets: 271 10*3/uL (ref 150–400)
RBC: 4.08 MIL/uL (ref 3.87–5.11)
RDW: 13.9 % (ref 11.5–15.5)
WBC: 10.6 10*3/uL — ABNORMAL HIGH (ref 4.0–10.5)
nRBC: 0 % (ref 0.0–0.2)

## 2020-09-02 LAB — COMPREHENSIVE METABOLIC PANEL
ALT: 115 U/L — ABNORMAL HIGH (ref 0–44)
AST: 157 U/L — ABNORMAL HIGH (ref 15–41)
Albumin: 3.8 g/dL (ref 3.5–5.0)
Alkaline Phosphatase: 60 U/L (ref 38–126)
Anion gap: 7 (ref 5–15)
BUN: 13 mg/dL (ref 6–20)
CO2: 23 mmol/L (ref 22–32)
Calcium: 9 mg/dL (ref 8.9–10.3)
Chloride: 110 mmol/L (ref 98–111)
Creatinine, Ser: 0.77 mg/dL (ref 0.44–1.00)
GFR, Estimated: >60 mL/min (ref >60)
Glucose, Bld: 93 mg/dL (ref 70–99)
Potassium: 4 mmol/L (ref 3.5–5.1)
Sodium: 140 mmol/L (ref 135–145)
Total Bilirubin: 0.5 mg/dL (ref 0.3–1.2)
Total Protein: 7 g/dL (ref 6.5–8.1)

## 2020-09-02 MED ORDER — IOHEXOL 300 MG/ML  SOLN
100.0000 mL | Freq: Once | INTRAMUSCULAR | Status: AC | PRN
  Administered 2020-09-02: 100 mL via INTRAVENOUS

## 2020-09-02 MED ORDER — METHOCARBAMOL 500 MG PO TABS
500.0000 mg | ORAL_TABLET | Freq: Three times a day (TID) | ORAL | 0 refills | Status: DC | PRN
Start: 2020-09-02 — End: 2020-11-19

## 2020-09-02 MED ORDER — NAPROXEN 375 MG PO TABS: 375.0000 mg | ORAL_TABLET | Freq: Two times a day (BID) | ORAL | 0 refills | Status: DC | PRN
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
## 2020-09-02 NOTE — ED Notes (Signed)
In X ray
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
## 2020-09-02 NOTE — ED Notes (Signed)
Patient to CT.

## 2020-11-19 ENCOUNTER — Encounter (HOSPITAL_COMMUNITY): Payer: Self-pay | Admitting: Emergency Medicine

## 2020-11-19 ENCOUNTER — Emergency Department (HOSPITAL_COMMUNITY)
Admission: EM | Admit: 2020-11-19 | Discharge: 2020-11-19 | Disposition: A | Discharge status: Home / Self Care | Payer: Medicaid Other | Attending: Emergency Medicine | Admitting: Emergency Medicine

## 2020-11-19 ENCOUNTER — Emergency Department (HOSPITAL_COMMUNITY): Payer: Medicaid Other

## 2020-11-19 ENCOUNTER — Other Ambulatory Visit: Payer: Self-pay

## 2020-11-19 DIAGNOSIS — Y92003 Bedroom of unspecified non-institutional (private) residence as the place of occurrence of the external cause: Secondary | ICD-10-CM | POA: Insufficient documentation

## 2020-11-19 DIAGNOSIS — S0083XA Contusion of other part of head, initial encounter: Secondary | ICD-10-CM

## 2020-11-19 DIAGNOSIS — Z23 Encounter for immunization: Secondary | ICD-10-CM | POA: Diagnosis not present

## 2020-11-19 DIAGNOSIS — S00532A Contusion of oral cavity, initial encounter: Secondary | ICD-10-CM | POA: Insufficient documentation

## 2020-11-19 DIAGNOSIS — S00531A Contusion of lip, initial encounter: Secondary | ICD-10-CM | POA: Insufficient documentation

## 2020-11-19 DIAGNOSIS — F17210 Nicotine dependence, cigarettes, uncomplicated: Secondary | ICD-10-CM | POA: Diagnosis not present

## 2020-11-19 DIAGNOSIS — S0993XA Unspecified injury of face, initial encounter: Secondary | ICD-10-CM | POA: Diagnosis present

## 2020-11-19 MED ORDER — TETANUS-DIPHTH-ACELL PERTUSSIS 5-2.5-18.5 LF-MCG/0.5 IM SUSY
0.5000 mL | PREFILLED_SYRINGE | Freq: Once | INTRAMUSCULAR | Status: AC
  Administered 2020-11-19: 0.5 mL via INTRAMUSCULAR
  Filled 2020-11-19: qty 0.5
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
## 2020-11-19 NOTE — ED Notes (Signed)
ED Provider at bedside. 

## 2020-11-19 NOTE — Discharge Instructions (Addendum)
Thank you for allowing me to care for you today in the Emergency Department.   Apply an ice pack to areas that are painful or swollen for 15 to 20 minutes up to 3-4 times a day for the next 5 days.  Take 650 mg of Tylenol or 600 mg of ibuprofen with food every 6 hours for pain.  You can alternate between these 2 medications every 3 hours if your pain returns.  For instance, you can take Tylenol at noon, followed by a dose of ibuprofen at 3, followed by second dose of Tylenol and 6.  Ibuprofen especially will help with pain and swelling.  The wound on your lip and the swelling should significantly improve in the next 48 to 72 hours.  You can follow a soft diet until you are able to easily to with your child without significant pain.  Follow-up with primary care if your symptoms do not significantly improve with this regimen in the next 5 to 7 days.  Return to the emergency department if you feel unsafe or you are staying, if you have any chest pain, trauma, if you have a loss of consciousness, or other new, concerning symptoms.
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
## 2020-12-23 ENCOUNTER — Other Ambulatory Visit: Payer: Self-pay

## 2020-12-23 ENCOUNTER — Emergency Department (HOSPITAL_COMMUNITY)
Admission: EM | Admit: 2020-12-23 | Discharge: 2020-12-24 | Disposition: A | Discharge status: Home / Self Care | Payer: Medicaid Other | Attending: Emergency Medicine | Admitting: Emergency Medicine

## 2020-12-23 DIAGNOSIS — N9489 Other specified conditions associated with female genital organs and menstrual cycle: Secondary | ICD-10-CM | POA: Diagnosis not present

## 2020-12-23 DIAGNOSIS — N938 Other specified abnormal uterine and vaginal bleeding: Secondary | ICD-10-CM | POA: Insufficient documentation

## 2020-12-23 DIAGNOSIS — Z5321 Procedure and treatment not carried out due to patient leaving prior to being seen by health care provider: Secondary | ICD-10-CM | POA: Diagnosis not present

## 2020-12-23 NOTE — ED Provider Notes (Signed)
Emergency Medicine Provider Triage Evaluation Note  Olivia Wagner , a 25 y.o. female  was evaluated in triage.  Pt complains of vaginal bleeding onset today. Some preceding nausea and dizziness, suprapubic cramping. Thinks she may be pregnant, but home preg test negative. Reports LMP on 12/04/20.  Review of Systems  Positive: Vaginal bleeding Negative: Fever   Physical Exam  BP 94/75 (BP Location: Left Arm)   Pulse 69   Temp 98.5 F (36.9 C) (Oral)   Resp 16   SpO2 100%  Gen:   Awake, no distress   Resp:  Normal effort  MSK:   Moves extremities without difficulty  Other:  Nondistended abdomen  Medical Decision Making  Medically screening exam initiated at 10:58 PM.  Appropriate orders placed.  [place] Pickett-Lawrence was informed that the remainder of the evaluation will be completed by another provider, this initial triage assessment does not replace that evaluation, and the importance of remaining in the ED until their evaluation is complete.  Metrorrhagia    Antony Madura, PA-C 12/23/20 2300    Jacalyn Lefevre, MD 12/24/20 1736

## 2020-12-23 NOTE — ED Triage Notes (Signed)
Last day of period was July 24.  Began bleeding today  Pt has taken a pregnancy test that was negative but has miscarried before and is worried this is what is happening.

## 2020-12-24 LAB — I-STAT CHEM 8, ED
BUN: 12 mg/dL (ref 6–20)
Calcium, Ion: 1.21 mmol/L (ref 1.15–1.40)
Chloride: 105 mmol/L (ref 98–111)
Creatinine, Ser: 0.8 mg/dL (ref 0.44–1.00)
Glucose, Bld: 75 mg/dL (ref 70–99)
HCT: 41 % (ref 36.0–46.0)
Hemoglobin: 13.9 g/dL (ref 12.0–15.0)
Potassium: 3.5 mmol/L (ref 3.5–5.1)
Sodium: 141 mmol/L (ref 135–145)
TCO2: 25 mmol/L (ref 22–32)

## 2020-12-24 LAB — I-STAT BETA HCG BLOOD, ED (MC, WL, AP ONLY): I-stat hCG, quantitative: <5 m[IU]/mL (ref <5)

## 2020-12-24 NOTE — ED Notes (Signed)
Pt states that she is leaving.  

## 2021-01-15 ENCOUNTER — Encounter (HOSPITAL_COMMUNITY): Payer: Self-pay

## 2021-01-15 ENCOUNTER — Other Ambulatory Visit: Payer: Self-pay

## 2021-01-15 ENCOUNTER — Ambulatory Visit (HOSPITAL_COMMUNITY)
Admission: EM | Admit: 2021-01-15 | Discharge: 2021-01-15 | Disposition: A | Discharge status: Home / Self Care | Payer: Medicaid Other | Attending: Physician Assistant | Admitting: Physician Assistant

## 2021-01-15 DIAGNOSIS — R5381 Other malaise: Secondary | ICD-10-CM | POA: Insufficient documentation

## 2021-01-15 DIAGNOSIS — R5383 Other fatigue: Secondary | ICD-10-CM | POA: Insufficient documentation

## 2021-01-15 DIAGNOSIS — R110 Nausea: Secondary | ICD-10-CM | POA: Insufficient documentation

## 2021-01-15 DIAGNOSIS — R1084 Generalized abdominal pain: Secondary | ICD-10-CM | POA: Diagnosis not present

## 2021-01-15 LAB — POCT URINALYSIS DIPSTICK, ED / UC
Bilirubin Urine: NEGATIVE
Glucose, UA: NEGATIVE mg/dL
Hgb urine dipstick: NEGATIVE
Ketones, ur: NEGATIVE mg/dL
Leukocytes,Ua: NEGATIVE
Nitrite: NEGATIVE
Protein, ur: NEGATIVE mg/dL
Specific Gravity, Urine: 1.02 (ref 1.005–1.030)
Urobilinogen, UA: 0.2 mg/dL (ref 0.0–1.0)
pH: 7 (ref 5.0–8.0)

## 2021-01-15 LAB — CBC WITH DIFFERENTIAL/PLATELET
Abs Immature Granulocytes: 0.02 10*3/uL (ref 0.00–0.07)
Basophils Absolute: 0 10*3/uL (ref 0.0–0.1)
Basophils Relative: 1 %
Eosinophils Absolute: 0.1 10*3/uL (ref 0.0–0.5)
Eosinophils Relative: 1 %
HCT: 38.1 % (ref 36.0–46.0)
Hemoglobin: 13.1 g/dL (ref 12.0–15.0)
Immature Granulocytes: 0 %
Lymphocytes Relative: 40 %
Lymphs Abs: 2 10*3/uL (ref 0.7–4.0)
MCH: 33 pg (ref 26.0–34.0)
MCHC: 34.4 g/dL (ref 30.0–36.0)
MCV: 96 fL (ref 80.0–100.0)
Monocytes Absolute: 0.4 10*3/uL (ref 0.1–1.0)
Monocytes Relative: 8 %
Neutro Abs: 2.6 10*3/uL (ref 1.7–7.7)
Neutrophils Relative %: 50 %
Platelets: 326 10*3/uL (ref 150–400)
RBC: 3.97 MIL/uL (ref 3.87–5.11)
RDW: 13.2 % (ref 11.5–15.5)
WBC: 5.1 10*3/uL (ref 4.0–10.5)
nRBC: 0 % (ref 0.0–0.2)

## 2021-01-15 LAB — COMPREHENSIVE METABOLIC PANEL
ALT: 17 U/L (ref 0–44)
AST: 21 U/L (ref 15–41)
Albumin: 3.6 g/dL (ref 3.5–5.0)
Alkaline Phosphatase: 69 U/L (ref 38–126)
Anion gap: 7 (ref 5–15)
BUN: <5 mg/dL — ABNORMAL LOW (ref 6–20)
CO2: 25 mmol/L (ref 22–32)
Calcium: 9 mg/dL (ref 8.9–10.3)
Chloride: 107 mmol/L (ref 98–111)
Creatinine, Ser: 0.77 mg/dL (ref 0.44–1.00)
GFR, Estimated: >60 mL/min (ref >60)
Glucose, Bld: 82 mg/dL (ref 70–99)
Potassium: 4 mmol/L (ref 3.5–5.1)
Sodium: 139 mmol/L (ref 135–145)
Total Bilirubin: 0.4 mg/dL (ref 0.3–1.2)
Total Protein: 6.9 g/dL (ref 6.5–8.1)

## 2021-01-15 LAB — POC URINE PREG, ED: Preg Test, Ur: NEGATIVE

## 2021-01-15 MED ORDER — ONDANSETRON 4 MG PO TBDP
4.0000 mg | ORAL_TABLET | Freq: Three times a day (TID) | ORAL | 0 refills | Status: DC | PRN
Start: 2021-01-15 — End: 2023-04-26

## 2021-01-15 NOTE — ED Provider Notes (Signed)
[facility]    CSN: [id number] Arrival date & time: 01/15/21  1814      History   Chief Complaint Chief Complaint  Patient presents with   Possible Pregnancy    HPI Olivia Wagner is a 25 y.o. female.   Patient presents today with a several day history of GI symptoms.  Reports some abdominal cramping as well as nausea.  She is concerned that she might be pregnant as she had irregular menstrual cycles this month with LMP 12/24/2020.  She does report this menstrual cycle was heavy but denies regular menorrhagia.  She does report some fatigue, generalized weakness, malaise.  She denies history of thyroid condition or anemia.  She has not tried any over-the-counter medication for symptom management.  Denies any vomiting, changes in bowel habits, melena, hematochezia.  She does report having unprotected sex and has no specific concern for STI but is open to testing.  Denies history of gastrointestinal disorder including GERD.   History reviewed. No pertinent past medical history.  There are no problems to display for this patient.   History reviewed. No pertinent surgical history.  OB History   No obstetric history on file.      Home Medications    Prior to Admission medications   Medication Sig Start Date End Date Taking? Authorizing Provider  ondansetron (ZOFRAN ODT) 4 MG disintegrating tablet Take 1 tablet (4 mg total) by mouth every 8 (eight) hours as needed for nausea or vomiting. 01/15/21  Yes Ketih Goodie, Noberto Retort, PA-C    Family History History reviewed. No pertinent family history.  Social History Social History   Tobacco Use   Smoking status: Every Day    Packs/day: 0.10    Types: Cigarettes   Smokeless tobacco: Never  Substance Use Topics   Alcohol use: Yes   Drug use: Not Currently     Allergies   Shellfish allergy   Review of Systems Review of Systems  Constitutional:  Negative for activity change, appetite change, fatigue and  fever.  Respiratory:  Negative for cough and shortness of breath.   Cardiovascular:  Negative for chest pain.  Gastrointestinal:  Positive for abdominal pain and nausea. Negative for diarrhea and vomiting.  Genitourinary:  Negative for dysuria, frequency, pelvic pain, urgency, vaginal bleeding, vaginal discharge and vaginal pain.  Neurological:  Negative for dizziness, light-headedness and headaches.    Physical Exam Triage Vital Signs ED Triage Vitals  Enc Vitals Group     BP 01/15/21 1925 118/65     Pulse Rate 01/15/21 1925 70     Resp 01/15/21 1925 16     Temp 01/15/21 1925 98.9 F (37.2 C)     Temp Source 01/15/21 1925 Oral     SpO2 01/15/21 1925 100 %     Weight --      Height --      Head Circumference --      Peak Flow --      Pain Score 01/15/21 1924 0     Pain Loc --      Pain Edu? --      Excl. in GC? --    No data found.  Updated Vital Signs BP 118/65 (BP Location: Right Arm)   Pulse 70   Temp 98.9 F (37.2 C) (Oral)   Resp 16   SpO2 100%   Visual Acuity Right Eye Distance:   Left Eye Distance:   Bilateral Distance:    Right Eye Near:   Left Eye  Near:    Bilateral Near:     Physical Exam Vitals reviewed.  Constitutional:      General: She is awake. She is not in acute distress.    Appearance: Normal appearance. She is normal weight. She is not ill-appearing.     Comments: Very pleasant female appears stated age in no acute distress sitting comfortably in exam room  HENT:     Head: Normocephalic and atraumatic.  Cardiovascular:     Rate and Rhythm: Normal rate and regular rhythm.     Heart sounds: Normal heart sounds, S1 normal and S2 normal. No murmur heard. Pulmonary:     Effort: Pulmonary effort is normal.     Breath sounds: Normal breath sounds. No wheezing, rhonchi or rales.     Comments: Clear to auscultation bilaterally Abdominal:     General: Bowel sounds are normal.     Palpations: Abdomen is soft.     Tenderness: There is no  abdominal tenderness. There is no right CVA tenderness, left CVA tenderness, guarding or rebound.     Comments: Benign abdominal exam  Genitourinary:    Comments: Exam deferred Psychiatric:        Behavior: Behavior is cooperative.     UC Treatments / Results  Labs (all labs ordered are listed, but only abnormal results are displayed) Labs Reviewed  COMPREHENSIVE METABOLIC PANEL  CBC WITH DIFFERENTIAL/PLATELET  POC URINE PREG, ED  POCT URINALYSIS DIPSTICK, ED / UC  CERVICOVAGINAL ANCILLARY ONLY    EKG   Radiology No results found.  Procedures Procedures (including critical care time)  Medications Ordered in UC Medications - No data to display  Initial Impression / Assessment and Plan / UC Course  I have reviewed the triage vital signs and the nursing notes.  Pertinent labs & imaging results that were available during my care of the patient were reviewed by me and considered in my medical decision making (see chart for details).      Vital signs and physical exam reassuring today; no indication for emergent evaluation or imaging.  Urine pregnancy test was negative in clinic today.  UA was negative.  Patient was given prescription for Zofran to be used for nausea with encouragement to eat small frequent meals and avoid spicy/acidic/fatty foods.  She is to drink plenty of fluid.  CBC and CMP obtained today given associated fatigue and general malaise-results pending.  Discussed that we do not have CT capabilities and if she has ongoing abdominal discomfort she should go to the emergency room for further evaluation.  STI swab was collected today-results pending.  Will defer treatment until results are obtained.  Discussed alarm symptoms that warrant emergent evaluation.  Strict return precautions given to which patient expressed understanding.  Final Clinical Impressions(s) / UC Diagnoses   Final diagnoses:  Generalized abdominal pain  Malaise and fatigue  Nausea without  vomiting     Discharge Instructions      Your urine pregnancy was negative.  We will contact you if your lab work is abnormal.  Use Zofran up to 3 times a day as needed for nausea.  Make sure you are drinking plenty of fluid and avoid spicy/acidic/fatty foods.  If your symptoms or not improving please return for reevaluation.  If you have any worsening symptoms including severe abdominal pain please go to the emergency room for CT scan as we discussed.     ED Prescriptions     Medication Sig Dispense Auth. Provider   ondansetron [facility]  ODT) 4 MG disintegrating tablet Take 1 tablet (4 mg total) by mouth every 8 (eight) hours as needed for nausea or vomiting. 20 tablet Bergen Magner, Noberto Retort, PA-C      PDMP not reviewed this encounter.   Jeani Hawking, PA-C 01/15/21 2009

## 2021-01-15 NOTE — ED Triage Notes (Signed)
Pt presents for pregnancy test.  

## 2021-01-15 NOTE — Discharge Instructions (Signed)
Your urine pregnancy was negative.  We will contact you if your lab work is abnormal.  Use Zofran up to 3 times a day as needed for nausea.  Make sure you are drinking plenty of fluid and avoid spicy/acidic/fatty foods.  If your symptoms or not improving please return for reevaluation.  If you have any worsening symptoms including severe abdominal pain please go to the emergency room for CT scan as we discussed.

## 2021-01-16 ENCOUNTER — Telehealth (HOSPITAL_COMMUNITY): Payer: Self-pay | Admitting: Emergency Medicine

## 2021-01-16 LAB — CERVICOVAGINAL ANCILLARY ONLY
Bacterial Vaginitis (gardnerella): NEGATIVE
Candida Glabrata: NEGATIVE
Candida Vaginitis: POSITIVE — AB
Chlamydia: NEGATIVE
Comment: NEGATIVE
Comment: NEGATIVE
Comment: NEGATIVE
Comment: NEGATIVE
Comment: NEGATIVE
Comment: NORMAL
Neisseria Gonorrhea: NEGATIVE
Trichomonas: NEGATIVE

## 2021-01-16 MED ORDER — FLUCONAZOLE 150 MG PO TABS
150.0000 mg | ORAL_TABLET | Freq: Once | ORAL | 0 refills | Status: AC
Start: 2021-01-16 — End: 2021-01-16

## 2021-05-20 ENCOUNTER — Other Ambulatory Visit: Payer: Self-pay

## 2021-05-20 ENCOUNTER — Encounter (HOSPITAL_BASED_OUTPATIENT_CLINIC_OR_DEPARTMENT_OTHER): Payer: Self-pay

## 2021-05-20 ENCOUNTER — Emergency Department (HOSPITAL_BASED_OUTPATIENT_CLINIC_OR_DEPARTMENT_OTHER)
Admission: EM | Admit: 2021-05-20 | Discharge: 2021-05-21 | Disposition: A | Discharge status: Home / Self Care | Payer: Medicaid Other | Attending: Emergency Medicine | Admitting: Emergency Medicine

## 2021-05-20 DIAGNOSIS — R0789 Other chest pain: Secondary | ICD-10-CM

## 2021-05-20 NOTE — ED Triage Notes (Addendum)
Patient here POV from Home with Torso Pain.  Patient states Pain began approximately 2 days PTA and has remained constant. Worsened with Spicy Intake. Pain is Right Lower Rib Cage. No Pain at Rest. Pain worsens with Movement.  NAD Noted during Triage. A&Ox4. GCS 15. Ambulatory.

## 2021-05-21 ENCOUNTER — Emergency Department (HOSPITAL_BASED_OUTPATIENT_CLINIC_OR_DEPARTMENT_OTHER): Payer: Medicaid Other | Admitting: Radiology

## 2021-05-21 DIAGNOSIS — R079 Chest pain, unspecified: Secondary | ICD-10-CM | POA: Diagnosis not present

## 2021-05-21 MED ORDER — IBUPROFEN 800 MG PO TABS: 800.0000 mg | ORAL_TABLET | Freq: Four times a day (QID) | ORAL | 0 refills | Status: DC | PRN

## 2021-05-21 NOTE — ED Notes (Signed)
Pt verbalizes understanding of discharge instructions. Opportunity for questioning and answers were provided. Pt discharged from ED to home.   ? ?

## 2021-05-21 NOTE — ED Provider Notes (Signed)
[facility] EMERGENCY DEPT Provider Note   CSN: [id number] Arrival date & time: 05/20/21  2100     History  Chief Complaint  Patient presents with   Torso Pain    Olivia Wagner is a 26 y.o. female.  Patient presents to the emergency department for evaluation of right-sided chest and rib pain.  Patient reports that symptoms have been present for 2 days.  She has had some continuous pain but there is a sharp component that waxes and wanes.  She has found an area on the right lower ribs that is tender.  She has not had any injury.  Symptoms began after she stopped vaping.  She reports that she has not had any significant cough or shortness of breath.   No abdominal pain, nausea or vomiting.      Home Medications Prior to Admission medications   Medication Sig Start Date End Date Taking? Authorizing Provider  ondansetron (ZOFRAN ODT) 4 MG disintegrating tablet Take 1 tablet (4 mg total) by mouth every 8 (eight) hours as needed for nausea or vomiting. 01/15/21   Raspet, Derry Skill, PA-C      Allergies    Shellfish allergy    Review of Systems   Review of Systems  Respiratory:  Negative for cough and shortness of breath.   Gastrointestinal: Negative.    Physical Exam Updated Vital Signs BP 107/74 (BP Location: Right Arm)    Pulse 78    Temp 98.3 F (36.8 C)    Resp 16    Ht 5' (1.524 m)    Wt 63.5 kg    SpO2 99%    BMI 27.34 kg/m  Physical Exam Vitals and nursing note reviewed.  Constitutional:      General: She is not in acute distress.    Appearance: Normal appearance. She is well-developed.  HENT:     Head: Normocephalic and atraumatic.     Right Ear: Hearing normal.     Left Ear: Hearing normal.     Nose: Nose normal.  Eyes:     Conjunctiva/sclera: Conjunctivae normal.     Pupils: Pupils are equal, round, and reactive to light.  Cardiovascular:     Rate and Rhythm: Regular rhythm.     Heart sounds: S1 normal and S2 normal. No murmur heard.    No friction rub. No gallop.  Pulmonary:     Effort: Pulmonary effort is normal. No respiratory distress.     Breath sounds: Normal breath sounds.  Chest:     Chest wall: Tenderness present. No crepitus.    Abdominal:     General: Bowel sounds are normal.     Palpations: Abdomen is soft.     Tenderness: There is no abdominal tenderness. There is no guarding or rebound. Negative signs include Murphy's sign and McBurney's sign.     Hernia: No hernia is present.  Musculoskeletal:        General: Normal range of motion.     Cervical back: Normal range of motion and neck supple.  Skin:    General: Skin is warm and dry.     Findings: No rash.  Neurological:     Mental Status: She is alert and oriented to person, place, and time.     GCS: GCS eye subscore is 4. GCS verbal subscore is 5. GCS motor subscore is 6.     Cranial Nerves: No cranial nerve deficit.     Sensory: No sensory deficit.     Coordination: Coordination normal.  Psychiatric:        Speech: Speech normal.        Behavior: Behavior normal.        Thought Content: Thought content normal.    ED Results / Procedures / Treatments   Labs (all labs ordered are listed, but only abnormal results are displayed) Labs Reviewed - No data to display  EKG EKG Interpretation  Date/Time:  Tuesday May 20 2021 21:31:19 EST Ventricular Rate:  80 PR Interval:  134 QRS Duration: 80 QT Interval:  362 QTC Calculation: 417 R Axis:   79 Text Interpretation: Normal sinus rhythm with sinus arrhythmia Normal ECG No previous ECGs available Confirmed by Orpah Greek [id number]) on 05/21/2021 12:05:53 AM  Radiology No results found.  Procedures Procedures    Medications Ordered in ED Medications - No data to display  ED Course/ Medical Decision Making/ A&P                           Medical Decision Making  Presents with right anterior chest wall and rib pain for a couple of days.  No known injury.  Oxygen saturations  normal.  Lungs are clear.  PERC negative.  EKG unremarkable, no left-sided chest pain, exertional pain.  No cardiac risk factors.  Chest x-ray clear, no pneumonia or pneumothorax, other pathology noted.  She does have point tenderness in the area of pain on the anterior rib without crepitance.  Likely inflammatory chest wall pain, patient reassured.  No further work-up necessary.        Final Clinical Impression(s) / ED Diagnoses Final diagnoses:  None    Rx / DC Orders ED Discharge Orders     None         Jetson Pickrel, Gwenyth Allegra, MD 05/21/21 0104

## 2021-05-31 ENCOUNTER — Emergency Department (HOSPITAL_BASED_OUTPATIENT_CLINIC_OR_DEPARTMENT_OTHER)
Admission: EM | Admit: 2021-05-31 | Discharge: 2021-05-31 | Disposition: A | Discharge status: Home / Self Care | Payer: Medicaid Other | Attending: Emergency Medicine | Admitting: Emergency Medicine

## 2021-05-31 ENCOUNTER — Emergency Department (HOSPITAL_BASED_OUTPATIENT_CLINIC_OR_DEPARTMENT_OTHER): Payer: Medicaid Other

## 2021-05-31 ENCOUNTER — Encounter (HOSPITAL_BASED_OUTPATIENT_CLINIC_OR_DEPARTMENT_OTHER): Payer: Self-pay

## 2021-05-31 ENCOUNTER — Other Ambulatory Visit: Payer: Self-pay

## 2021-05-31 ENCOUNTER — Emergency Department (HOSPITAL_COMMUNITY): Payer: Medicaid Other

## 2021-05-31 DIAGNOSIS — H53462 Homonymous bilateral field defects, left side: Secondary | ICD-10-CM | POA: Diagnosis not present

## 2021-05-31 DIAGNOSIS — H5462 Unqualified visual loss, left eye, normal vision right eye: Secondary | ICD-10-CM | POA: Diagnosis present

## 2021-05-31 DIAGNOSIS — H547 Unspecified visual loss: Secondary | ICD-10-CM | POA: Diagnosis not present

## 2021-05-31 LAB — BASIC METABOLIC PANEL
Anion gap: 6 (ref 5–15)
BUN: 10 mg/dL (ref 6–20)
CO2: 26 mmol/L (ref 22–32)
Calcium: 8.8 mg/dL — ABNORMAL LOW (ref 8.9–10.3)
Chloride: 107 mmol/L (ref 98–111)
Creatinine, Ser: 0.75 mg/dL (ref 0.44–1.00)
GFR, Estimated: >60 mL/min (ref >60)
Glucose, Bld: 77 mg/dL (ref 70–99)
Potassium: 4.2 mmol/L (ref 3.5–5.1)
Sodium: 139 mmol/L (ref 135–145)

## 2021-05-31 LAB — CBC WITH DIFFERENTIAL/PLATELET
Abs Immature Granulocytes: 0.01 10*3/uL (ref 0.00–0.07)
Basophils Absolute: 0 10*3/uL (ref 0.0–0.1)
Basophils Relative: 1 %
Eosinophils Absolute: 0.1 10*3/uL (ref 0.0–0.5)
Eosinophils Relative: 3 %
HCT: 36.9 % (ref 36.0–46.0)
Hemoglobin: 12.2 g/dL (ref 12.0–15.0)
Immature Granulocytes: 0 %
Lymphocytes Relative: 40 %
Lymphs Abs: 1.8 10*3/uL (ref 0.7–4.0)
MCH: 31.5 pg (ref 26.0–34.0)
MCHC: 33.1 g/dL (ref 30.0–36.0)
MCV: 95.3 fL (ref 80.0–100.0)
Monocytes Absolute: 0.4 10*3/uL (ref 0.1–1.0)
Monocytes Relative: 10 %
Neutro Abs: 2.1 10*3/uL (ref 1.7–7.7)
Neutrophils Relative %: 46 %
Platelets: 323 10*3/uL (ref 150–400)
RBC: 3.87 MIL/uL (ref 3.87–5.11)
RDW: 13.3 % (ref 11.5–15.5)
WBC: 4.4 10*3/uL (ref 4.0–10.5)
nRBC: 0 % (ref 0.0–0.2)

## 2021-05-31 MED ORDER — GADOBUTROL 1 MMOL/ML IV SOLN
6.5000 mL | Freq: Once | INTRAVENOUS | Status: AC | PRN
  Administered 2021-05-31: 6.5 mL via INTRAVENOUS

## 2021-05-31 NOTE — ED Notes (Signed)
Pt off the floor to MRI at this time.

## 2021-05-31 NOTE — ED Notes (Signed)
ED Provider at bedside. 

## 2021-05-31 NOTE — ED Triage Notes (Signed)
Pt c/o blurred vision to the top field of her L eye x 1 month. Pt has seen opthalmology since the problem started and received new glasses without improvement.
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
## 2021-05-31 NOTE — Discharge Instructions (Addendum)
Your MRI brain was normal. Call the ophthalmology clinic at the number provided in your discharge paperwork to arrange follow-up.

## 2021-05-31 NOTE — ED Provider Notes (Signed)
[facility] EMERGENCY DEPARTMENT Provider Note   CSN: [id number] Arrival date & time: 05/31/21  1159     History  Chief Complaint  Patient presents with   MRI/[facility]    Olivia Wagner is a 26 y.o. female who presents as a transfer from [facility] ED for MRI due to left eye vision loss.  Is been present since November but gradually worsening.  She has no difficulty seeing out of her right eye.  She has had her glasses prescription adjusted without improvement.  She has not had any nausea or vomiting.  She denies any eye pain.  She denies any trauma immediately precipitating the symptoms in November.  She denies recent infectious symptoms.  CT head was normal and the patient was transferred to [facility], ED for MRI to further evaluate patient's vision loss.     Home Medications Prior to Admission medications   Medication Sig Start Date End Date Taking? Authorizing Provider  ibuprofen (ADVIL) 800 MG tablet Take 1 tablet (800 mg total) by mouth every 6 (six) hours as needed for moderate pain. 05/21/21   Orpah Greek, MD  ondansetron (ZOFRAN ODT) 4 MG disintegrating tablet Take 1 tablet (4 mg total) by mouth every 8 (eight) hours as needed for nausea or vomiting. 01/15/21   Raspet, Derry Skill, PA-C      Allergies    Shellfish allergy    Review of Systems   Review of Systems  Constitutional:  Negative for chills and fever.  Eyes:  Positive for visual disturbance. Negative for photophobia, pain, discharge and redness.  Gastrointestinal:  Negative for nausea and vomiting.  Musculoskeletal:  Negative for neck pain.  Neurological:  Negative for light-headedness and headaches.   Physical Exam Updated Vital Signs BP 109/70 (BP Location: Right Arm)    Pulse 81    Temp 98.5 F (36.9 C) (Oral)    Resp 18    Ht 5' (1.524 m)    Wt 65.8 kg    LMP 05/18/2021    SpO2 96%    BMI 28.32 kg/m  Physical Exam Vitals and nursing note reviewed.  Constitutional:       General: She is not in acute distress.    Appearance: She is well-developed.  HENT:     Head: Normocephalic and atraumatic.     Right Ear: External ear normal.     Left Ear: External ear normal.  Eyes:     Extraocular Movements: Extraocular movements intact.     Conjunctiva/sclera: Conjunctivae normal.     Pupils: Pupils are equal, round, and reactive to light.     Visual Fields: Right eye visual fields normal.     Left eye: ABS in the upper nasal quadrant.  Cardiovascular:     Rate and Rhythm: Normal rate and regular rhythm.  Pulmonary:     Effort: Pulmonary effort is normal. No respiratory distress.  Abdominal:     Palpations: Abdomen is soft.     Tenderness: There is no abdominal tenderness.  Musculoskeletal:        General: No swelling.     Cervical back: Neck supple.  Skin:    General: Skin is warm and dry.     Capillary Refill: Capillary refill takes less than 2 seconds.  Neurological:     General: No focal deficit present.     Mental Status: She is alert and oriented to person, place, and time.     GCS: GCS eye subscore is 4. GCS verbal subscore  is 5. GCS motor subscore is 6.     Sensory: Sensation is intact.     Motor: Motor function is intact.     Coordination: Coordination is intact.     Gait: Gait is intact.  Psychiatric:        Mood and Affect: Mood normal.    ED Results / Procedures / Treatments   Labs (all labs ordered are listed, but only abnormal results are displayed) Labs Reviewed  BASIC METABOLIC PANEL - Abnormal; Notable for the following components:      Result Value   Calcium 8.8 (*)    All other components within normal limits  CBC WITH DIFFERENTIAL/PLATELET    EKG None  Radiology CT Head Wo Contrast  Result Date: 05/31/2021 CLINICAL DATA:  Vision loss, monocular left hemianopia in the upper field EXAM: CT HEAD WITHOUT CONTRAST TECHNIQUE: Contiguous axial images were obtained from the base of the skull through the vertex without  intravenous contrast. RADIATION DOSE REDUCTION: This exam was performed according to the departmental dose-optimization program which includes automated exposure control, adjustment of the mA and/or kV according to patient size and/or use of iterative reconstruction technique. COMPARISON:  09/02/2020 FINDINGS: Brain: No evidence of acute infarction, hemorrhage, hydrocephalus, extra-axial collection or mass lesion/mass effect. Vascular: No hyperdense vessel or unexpected calcification. Skull: Normal. Negative for fracture or focal lesion. Sinuses/Orbits: No acute finding. Other: None. IMPRESSION: No acute intracranial abnormality. Electronically Signed   By: Davina Poke D.O.   On: 05/31/2021 14:56   MR Brain W and Wo Contrast  Result Date: 05/31/2021 CLINICAL DATA:  Vision loss, monocular EXAM: MRI HEAD WITHOUT AND WITH CONTRAST TECHNIQUE: Multiplanar, multiecho pulse sequences of the brain and surrounding structures were obtained without and with intravenous contrast. CONTRAST:  6.5mL GADAVIST GADOBUTROL 1 MMOL/ML IV SOLN COMPARISON:  None. FINDINGS: Brain: There is no acute infarction or intracranial hemorrhage. There is no intracranial mass, mass effect, or edema. There is no hydrocephalus or extra-axial fluid collection. Ventricles and sulci are normal in size and configuration. No abnormal enhancement. Vascular: Major vessel flow voids at the skull base are preserved. Skull and upper cervical spine: Normal marrow signal is preserved. Sinuses/Orbits: Paranasal sinuses are aerated. Orbits are unremarkable. Other: Sella is unremarkable.  Mastoid air cells are clear. IMPRESSION: No acute infarction, hemorrhage, or mass.  No abnormal enhancement. Electronically Signed   By: Macy Mis M.D.   On: 05/31/2021 19:40    Procedures Procedures   Medications Ordered in ED Medications  gadobutrol (GADAVIST) 1 MMOL/ML injection 6.5 mL (6.5 mLs Intravenous Contrast Given 05/31/21 1910)    ED Course/  Medical Decision Making/ A&P                            Patient presents with vision loss as described in HPI and physical exam above.  Patient was sent to [facility] for MRI.  Patient's MRI shows no acute intracranial abnormality.  No evidence of multiple sclerosis, mass, or optic tract abnormality.  Given unremarkable MRI and chronic symptoms with otherwise normal neurologic exam, I believe the patient is appropriate for outpatient follow-up with ophthalmology.  I discussed the results of the MRI with the patient and patient's family member at bedside.  All their questions were answered appropriately.  Discharge instructions and return precautions were discussed with the patient and her family member at bedside prior to discharge and included in the AVS.  The patient voiced understanding of these instructions  and was amenable with the plan as described.  The patient was then discharged in stable condition.  Final Clinical Impression(s) / ED Diagnoses Final diagnoses:  Left homonymous superior quadrantanopia    Rx / DC Orders ED Discharge Orders     None         Estelle Skibicki, Amalia Hailey, MD 06/01/21 1147    Luna Fuse, MD 06/01/21 2220

## 2021-05-31 NOTE — ED Notes (Signed)
Charge nurse called, made aware of ED-ED transfer POV. RN made aware IV is in place for MRI.

## 2021-08-07 ENCOUNTER — Other Ambulatory Visit: Payer: Self-pay

## 2021-08-07 ENCOUNTER — Emergency Department (HOSPITAL_BASED_OUTPATIENT_CLINIC_OR_DEPARTMENT_OTHER)
Admission: EM | Admit: 2021-08-07 | Discharge: 2021-08-07 | Disposition: A | Discharge status: Home / Self Care | Payer: Medicaid Other | Attending: Emergency Medicine | Admitting: Emergency Medicine

## 2021-08-07 ENCOUNTER — Encounter (HOSPITAL_BASED_OUTPATIENT_CLINIC_OR_DEPARTMENT_OTHER): Payer: Self-pay | Admitting: Obstetrics and Gynecology

## 2021-08-07 DIAGNOSIS — R5383 Other fatigue: Secondary | ICD-10-CM | POA: Diagnosis not present

## 2021-08-07 DIAGNOSIS — Z20822 Contact with and (suspected) exposure to covid-19: Secondary | ICD-10-CM | POA: Diagnosis not present

## 2021-08-07 DIAGNOSIS — R070 Pain in throat: Secondary | ICD-10-CM | POA: Diagnosis not present

## 2021-08-07 DIAGNOSIS — J029 Acute pharyngitis, unspecified: Secondary | ICD-10-CM | POA: Insufficient documentation

## 2021-08-07 LAB — RESP PANEL BY RT-PCR (FLU A&B, COVID) ARPGX2
Influenza A by PCR: NEGATIVE
Influenza B by PCR: NEGATIVE
SARS Coronavirus 2 by RT PCR: NEGATIVE

## 2021-08-07 LAB — GROUP A STREP BY PCR: Group A Strep by PCR: NOT DETECTED

## 2021-08-07 LAB — MONONUCLEOSIS SCREEN: Mono Screen: NEGATIVE

## 2021-08-07 MED ORDER — LIDOCAINE VISCOUS HCL 2 % MT SOLN
15.0000 mL | Freq: Once | OROMUCOSAL | Status: AC
  Administered 2021-08-07: 15 mL via OROMUCOSAL
  Filled 2021-08-07: qty 15

## 2021-08-07 MED ORDER — LIDOCAINE VISCOUS HCL 2 % MT SOLN
15.0000 mL | Freq: Four times a day (QID) | OROMUCOSAL | 0 refills | Status: DC | PRN
Start: 2021-08-07 — End: 2023-04-26

## 2021-08-07 NOTE — ED Notes (Signed)
Ppt describes having a sore throat since 07/17/21, worse on the left side of her throat.  Labs have been sent, awaiting results ?

## 2021-08-07 NOTE — Discharge Instructions (Addendum)
You came to the emergency department today to be evaluated for your sore throat.  Your physical exam was reassuring.  You were negative for strep throat and mono.  I have given you prescription for viscous lidocaine.  You may use this to help with your sore throat.  If your symptoms do not improve in 7 days please follow-up with ENT provider listed on this paperwork. ? ?Please take Ibuprofen (Advil, motrin) and Tylenol (acetaminophen) to relieve your pain.   ? ?You may take up to 600 MG (3 pills) of normal strength ibuprofen every 8 hours as needed.   ?You make take tylenol, up to 1,000 mg (two extra strength pills) every 8 hours as needed.  ? ?It is safe to take ibuprofen and tylenol at the same time as they work differently.  ? Do not take more than 3,000 mg tylenol in a 24 hour period (not more than one dose every 8 hours.  Please check all medication labels as many medications such as pain and cold medications may contain tylenol.  Do not drink alcohol while taking these medications.  Do not take other NSAID'S while taking ibuprofen (such as aleve or naproxen).  Please take ibuprofen with food to decrease stomach upset. ? ? ?Get help right away if: ?You have difficulty breathing. ?You cannot swallow fluids, soft foods, or your saliva. ?You have increased swelling in your throat or neck. ?You have persistent nausea and vomiting. ?

## 2021-08-07 NOTE — ED Provider Notes (Signed)
?[facility] EMERGENCY DEPT ?Provider Note ? ? ?CSN: [id number] ?Arrival date & time: 08/07/21  1820 ? ?  ? ?History ? ?Chief Complaint  ?Patient presents with  ? Sore Throat  ? ? ?Olivia Wagner is a 26 y.o. female with no pertinent past medical history.  Presents emergency department with a chief complaint of sore throat.  Patient reports that sore throat has been present for the last 3 weeks.  Patient has been using over-the-counter pain medication with some improvement in her symptoms.  Patient is concerned for swelling to the left tonsil.  Patient endorses trouble swallowing, drooling, chills and fatigue. ? ?Denies any fever, trismus, hot potato voice, facial swelling, neck pain, neck stiffness, shortness of breath, chest pain, abdominal pain, nausea, vomiting, diarrhea, cough.   ? ?Sore Throat ?Pertinent negatives include no chest pain, no abdominal pain and no shortness of breath.  ?  ? ?Home Medications ?Prior to Admission medications   ?Medication Sig Start Date End Date Taking? Authorizing Provider  ?ibuprofen (ADVIL) 800 MG tablet Take 1 tablet (800 mg total) by mouth every 6 (six) hours as needed for moderate pain. 05/21/21   Gilda Crease, MD  ?ondansetron (ZOFRAN ODT) 4 MG disintegrating tablet Take 1 tablet (4 mg total) by mouth every 8 (eight) hours as needed for nausea or vomiting. 01/15/21   Raspet, Noberto Retort, PA-C  ?   ? ?Allergies    ?Shellfish allergy   ? ?Review of Systems   ?Review of Systems  ?Constitutional:  Positive for chills and fatigue. Negative for fever.  ?HENT:  Positive for drooling, sore throat and trouble swallowing. Negative for congestion, dental problem, facial swelling, rhinorrhea, sinus pressure, sinus pain and voice change.   ?Eyes:  Negative for visual disturbance.  ?Respiratory:  Negative for cough and shortness of breath.   ?Cardiovascular:  Negative for chest pain.  ?Gastrointestinal:  Negative for abdominal pain, diarrhea, nausea and vomiting.   ?Musculoskeletal:  Negative for neck pain and neck stiffness.  ?Skin:  Negative for color change, pallor, rash and wound.  ?Psychiatric/Behavioral:  Negative for confusion.   ? ?Physical Exam ?Updated Vital Signs ?BP 105/73 (BP Location: Right Arm)   Pulse 74   Temp 98.6 ?F (37 ?C)   Resp 16   Ht 5' (1.524 m)   Wt 63.5 kg   LMP 08/05/2021 (Exact Date)   SpO2 99%   BMI 27.34 kg/m?  ?Physical Exam ?Vitals and nursing note reviewed.  ?Constitutional:   ?   General: She is not in acute distress. ?   Appearance: She is not ill-appearing, toxic-appearing or diaphoretic.  ?HENT:  ?   Head: Normocephalic and atraumatic.  ?   Jaw: No trismus, tenderness, swelling, pain on movement or malocclusion.  ?   Comments: No facial swelling ?   Mouth/Throat:  ?   Lips: Pink. No lesions.  ?   Mouth: Mucous membranes are moist. No oral lesions or angioedema.  ?   Tongue: No lesions. Tongue does not deviate from midline.  ?   Palate: No mass and lesions.  ?   Pharynx: Oropharynx is clear. Uvula midline. No pharyngeal swelling, oropharyngeal exudate, posterior oropharyngeal erythema or uvula swelling.  ?   Tonsils: No tonsillar exudate or tonsillar abscesses. 1+ on the right. 1+ on the left.  ?   Comments: Handles oral secretions without difficulty ?Eyes:  ?   General: No scleral icterus.    ?   Right eye: No discharge.     ?  Left eye: No discharge.  ?Neck:  ?   Comments: No swelling to submandibular space ?Cardiovascular:  ?   Rate and Rhythm: Normal rate.  ?   Heart sounds: Normal heart sounds.  ?Pulmonary:  ?   Effort: Pulmonary effort is normal. No tachypnea, bradypnea or respiratory distress.  ?   Breath sounds: Normal breath sounds. No stridor.  ?   Comments: Speaks in full complete sentences without difficulty ?Abdominal:  ?   Palpations: Abdomen is soft.  ?   Tenderness: There is no abdominal tenderness.  ?Musculoskeletal:  ?   Cervical back: Full passive range of motion without pain, normal range of motion and neck  supple. No edema, erythema, signs of trauma, rigidity, torticollis or crepitus. No pain with movement, spinous process tenderness or muscular tenderness. Normal range of motion.  ?Lymphadenopathy:  ?   Cervical: No cervical adenopathy.  ?Skin: ?   General: Skin is warm and dry.  ?Neurological:  ?   General: No focal deficit present.  ?   Mental Status: She is alert.  ?Psychiatric:     ?   Behavior: Behavior is cooperative.  ? ? ?ED Results / Procedures / Treatments   ?Labs ?(all labs ordered are listed, but only abnormal results are displayed) ?Labs Reviewed  ?GROUP A STREP BY PCR  ?RESP PANEL BY RT-PCR (FLU A&B, COVID) ARPGX2  ?MONONUCLEOSIS SCREEN  ? ? ?EKG ?None ? ?Radiology ?No results found. ? ?Procedures ?Procedures  ? ? ?Medications Ordered in ED ?Medications  ?lidocaine (XYLOCAINE) 2 % viscous mouth solution 15 mL (15 mLs Mouth/Throat Given 08/07/21 2016)  ? ? ?ED Course/ Medical Decision Making/ A&P ?  ?                        ?Medical Decision Making ?Amount and/or Complexity of Data Reviewed ?Labs: ordered. ? ?Risk ?Prescription drug management. ? ? ?Alert 26 year old female no acute distress, nontoxic-appearing.  Present to the emergency department with a chief complaint of sore throat. ? ?Information obtained from patient.  Past medical records were reviewed including previous provider notes and labs. ? ?Group A strep PCR was placed while patient was in triage. ? ?On physical exam patient has no trismus, drooling, facial swelling, hot potato voice, uvula deviation, swelling to submandibular space, pain with passive range of motion to neck.  CT of neck was considered however low suspicion for deep space neck infection at this time.  No signs of peritonsillar abscess.  Tonsils without swelling or exudate. ? ?I personally viewed and interpreted patient's lab results which were negative for group A strep, mononucleosis, influenza and COVID-19. ? ?Patient has improvement in pain after receiving viscous  lidocaine.  Able to tolerate p.o. intake without difficulty.  Suspect possible viral pharyngitis.  Will give patient information to follow-up with ENT if symptoms do not improve.  Will prescribe patient with course of viscous lidocaine. ? ?Discussed results, findings, treatment and follow up. Patient advised of return precautions. Patient verbalized understanding and agreed with plan. ? ? ? ? ? ? ? ? ?Final Clinical Impression(s) / ED Diagnoses ?Final diagnoses:  ?Sore throat  ? ? ?Rx / DC Orders ?ED Discharge Orders   ? ?      Ordered  ?  lidocaine (XYLOCAINE) 2 % solution  Every 6 hours PRN       ? 08/07/21 2011  ? ?  ?  ? ?  ? ? ?  ?Haskel Schroeder, PA-C ?08/07/21  2349 ? ?  ?Terald Sleeperrifan, Matthew J, MD ?08/08/21 [phone] ? ?

## 2021-08-07 NOTE — ED Triage Notes (Signed)
Patient reports sore throat on the left side only. Reports it has been hurting x3 weeks ?

## 2022-09-21 ENCOUNTER — Encounter (HOSPITAL_BASED_OUTPATIENT_CLINIC_OR_DEPARTMENT_OTHER): Payer: Self-pay

## 2022-09-21 ENCOUNTER — Emergency Department (HOSPITAL_BASED_OUTPATIENT_CLINIC_OR_DEPARTMENT_OTHER)
Admission: EM | Admit: 2022-09-21 | Discharge: 2022-09-21 | Disposition: A | Discharge status: Home / Self Care | Payer: Medicaid Other | Attending: Emergency Medicine | Admitting: Emergency Medicine

## 2022-09-21 ENCOUNTER — Other Ambulatory Visit: Payer: Self-pay

## 2022-09-21 DIAGNOSIS — J029 Acute pharyngitis, unspecified: Secondary | ICD-10-CM | POA: Diagnosis not present

## 2022-09-21 LAB — GROUP A STREP BY PCR: Group A Strep by PCR: NOT DETECTED

## 2022-09-21 MED ORDER — LIDOCAINE VISCOUS HCL 2 % MT SOLN
15.0000 mL | Freq: Once | OROMUCOSAL | Status: AC
  Administered 2022-09-21: 15 mL via OROMUCOSAL
  Filled 2022-09-21: qty 15

## 2022-09-21 MED ORDER — ACETAMINOPHEN 500 MG PO TABS
1000.0000 mg | ORAL_TABLET | Freq: Once | ORAL | Status: AC
  Administered 2022-09-21: 1000 mg via ORAL
  Filled 2022-09-21: qty 2

## 2022-09-21 NOTE — ED Provider Notes (Addendum)
[place] EMERGENCY DEPARTMENT AT [facility] Provider Note   CSN: [id number] Arrival date & time: 09/21/22  0217     History  Chief Complaint  Patient presents with   Sore Throat    Patient states has had intermittent sore throat after being intubated a year ago. States has returned last few days. Pain with swallowing denies fevers      Olivia Wagner is a 27 y.o. female.  The history is provided by the patient.  Sore Throat This is a chronic problem. The current episode started more than 1 week ago (1 year ago patient was intubated and has had ongoing pain). Progression since onset: waxing and waning. Pertinent negatives include no abdominal pain. Nothing aggravates the symptoms. Nothing relieves the symptoms. She has tried nothing for the symptoms. The treatment provided no relief.  Patient has been seen for this multiple times in the past but has ongoing sore throat since intubation a year ago.  No fevers, no change in voice. No difficulty swallowing.     History reviewed. No pertinent past medical history.   Home Medications Prior to Admission medications   Medication Sig Start Date End Date Taking? Authorizing Provider  ibuprofen (ADVIL) 800 MG tablet Take 1 tablet (800 mg total) by mouth every 6 (six) hours as needed for moderate pain. 05/21/21   Gilda Crease, MD  lidocaine (XYLOCAINE) 2 % solution Use as directed 15 mLs in the mouth or throat every 6 (six) hours as needed for mouth pain. 08/07/21   Haskel Schroeder, PA-C  ondansetron (ZOFRAN ODT) 4 MG disintegrating tablet Take 1 tablet (4 mg total) by mouth every 8 (eight) hours as needed for nausea or vomiting. 01/15/21   Raspet, Noberto Retort, PA-C      Allergies    Shellfish allergy    Review of Systems   Review of Systems  Constitutional:  Negative for fever.  HENT:  Positive for sore throat. Negative for facial swelling and voice change.   Eyes:  Negative for redness.  Respiratory:   Negative for wheezing and stridor.   Gastrointestinal:  Negative for abdominal pain and vomiting.  All other systems reviewed and are negative.   Physical Exam Updated Vital Signs BP (!) 113/90 (BP Location: Left Arm)   Pulse 78   Temp 98.6 F (37 C)   Resp 18   Ht 5' (1.524 m)   Wt 70.3 kg   LMP 09/02/2022   SpO2 100%   BMI 30.27 kg/m  Physical Exam Vitals and nursing note reviewed.  Constitutional:      General: She is not in acute distress.    Appearance: Normal appearance. She is well-developed.  HENT:     Head: Normocephalic and atraumatic.     Nose: Nose normal.     Mouth/Throat:     Mouth: Mucous membranes are moist.     Pharynx: Oropharynx is clear. No oropharyngeal exudate or posterior oropharyngeal erythema.  Eyes:     Pupils: Pupils are equal, round, and reactive to light.  Neck:     Comments: Intact phonation, no pain with displacement of larynx  Cardiovascular:     Rate and Rhythm: Normal rate and regular rhythm.     Pulses: Normal pulses.     Heart sounds: Normal heart sounds.  Pulmonary:     Effort: Pulmonary effort is normal. No respiratory distress.     Breath sounds: Normal breath sounds.  Abdominal:     General: Bowel sounds are normal.  There is no distension.     Palpations: Abdomen is soft.     Tenderness: There is no abdominal tenderness. There is no guarding or rebound.  Genitourinary:    Vagina: No vaginal discharge.  Musculoskeletal:        General: Normal range of motion.     Cervical back: Normal range of motion and neck supple. No rigidity.  Lymphadenopathy:     Cervical: No cervical adenopathy.  Skin:    General: Skin is warm and dry.     Capillary Refill: Capillary refill takes less than 2 seconds.     Findings: No erythema or rash.  Neurological:     General: No focal deficit present.     Mental Status: She is alert and oriented to person, place, and time.     Deep Tendon Reflexes: Reflexes normal.  Psychiatric:        Mood  and Affect: Mood normal.     ED Results / Procedures / Treatments   Labs (all labs ordered are listed, but only abnormal results are displayed) Labs Reviewed  GROUP A STREP BY PCR    EKG None  Radiology No results found.  Procedures Procedures    Medications Ordered in ED Medications  lidocaine (XYLOCAINE) 2 % viscous mouth solution 15 mL (has no administration in time range)    ED Course/ Medical Decision Making/ A&P                             Medical Decision Making Sore throat for 1 year since intubation   Amount and/or Complexity of Data Reviewed External Data Reviewed: labs and notes.    Details: Previous notes reviewed, previous labs reviewed from visits  Labs: ordered.    Details: Strep is negative   Risk OTC drugs. Prescription drug management. Risk Details: Well appearing, normal exam and vitals.  Highly doubt deep tissue infection ongoing for 1 year, intact phonation and no pain with movement of the voice box.  Swallowing secretions.  Alternate tylenol and ibuprofen, follow up with ENT for ongoing care, referral given to ENT.  Strict return precautions    Final Clinical Impression(s) / ED Diagnoses Final diagnoses:  Sore throat   Return for intractable cough, coughing up blood, fevers > 100.4 unrelieved by medication, shortness of breath, intractable vomiting, chest pain, shortness of breath, weakness, numbness, changes in speech, facial asymmetry, abdominal pain, passing out, Inability to tolerate liquids or food, cough, altered mental status or any concerns. No signs of systemic illness or infection. The patient is nontoxic-appearing on exam and vital signs are within normal limits.  I have reviewed the triage vital signs and the nursing notes. Pertinent labs & imaging results that were available during my care of the patient were reviewed by me and considered in my medical decision making (see chart for details). After history, exam, and medical workup  I feel the patient has been appropriately medically screened and is safe for discharge home. Pertinent diagnoses were discussed with the patient. Patient was given return precautions    Xitlalli Newhard, MD 09/21/22 [phone]

## 2022-09-21 NOTE — ED Triage Notes (Signed)
Patient states has had intermittent sore throat after being intubated a year ago. States has returned last few days. Pain with swallowing denies fevers

## 2022-10-03 ENCOUNTER — Encounter (HOSPITAL_BASED_OUTPATIENT_CLINIC_OR_DEPARTMENT_OTHER): Payer: Self-pay

## 2022-10-03 ENCOUNTER — Other Ambulatory Visit: Payer: Self-pay

## 2022-10-03 ENCOUNTER — Emergency Department (HOSPITAL_BASED_OUTPATIENT_CLINIC_OR_DEPARTMENT_OTHER)
Admission: EM | Admit: 2022-10-03 | Discharge: 2022-10-04 | Disposition: A | Discharge status: Home / Self Care | Payer: Medicaid Other | Attending: Emergency Medicine | Admitting: Emergency Medicine

## 2022-10-03 DIAGNOSIS — F419 Anxiety disorder, unspecified: Secondary | ICD-10-CM | POA: Diagnosis not present

## 2022-10-03 DIAGNOSIS — T7840XA Allergy, unspecified, initial encounter: Secondary | ICD-10-CM | POA: Insufficient documentation

## 2022-10-03 DIAGNOSIS — R002 Palpitations: Secondary | ICD-10-CM | POA: Diagnosis not present

## 2022-10-03 DIAGNOSIS — D649 Anemia, unspecified: Secondary | ICD-10-CM | POA: Diagnosis not present

## 2022-10-03 LAB — CBC WITH DIFFERENTIAL/PLATELET
Abs Immature Granulocytes: 0.01 10*3/uL (ref 0.00–0.07)
Basophils Absolute: 0 10*3/uL (ref 0.0–0.1)
Basophils Relative: 0 %
Eosinophils Absolute: 0.2 10*3/uL (ref 0.0–0.5)
Eosinophils Relative: 2 %
HCT: 32.2 % — ABNORMAL LOW (ref 36.0–46.0)
Hemoglobin: 10.5 g/dL — ABNORMAL LOW (ref 12.0–15.0)
Immature Granulocytes: 0 %
Lymphocytes Relative: 34 %
Lymphs Abs: 2.5 10*3/uL (ref 0.7–4.0)
MCH: 30.4 pg (ref 26.0–34.0)
MCHC: 32.6 g/dL (ref 30.0–36.0)
MCV: 93.3 fL (ref 80.0–100.0)
Monocytes Absolute: 0.4 10*3/uL (ref 0.1–1.0)
Monocytes Relative: 6 %
Neutro Abs: 4.3 10*3/uL (ref 1.7–7.7)
Neutrophils Relative %: 58 %
Platelets: 402 10*3/uL — ABNORMAL HIGH (ref 150–400)
RBC: 3.45 MIL/uL — ABNORMAL LOW (ref 3.87–5.11)
RDW: 14.1 % (ref 11.5–15.5)
WBC: 7.5 10*3/uL (ref 4.0–10.5)
nRBC: 0 % (ref 0.0–0.2)

## 2022-10-03 NOTE — ED Triage Notes (Signed)
POV from home, A&O x 4, amb to room, GCS 15  Pt sts that she thinks that she had an allergic reaction or panic attack.  C/o chest pain, throat closing up and heart racing for approx one hour. Sts that now she just feels like her heart is racing and a tightness in her chest.

## 2022-10-03 NOTE — ED Provider Notes (Incomplete)
[place] EMERGENCY DEPARTMENT AT [facility] Provider Note  CSN: [id number] Arrival date & time: 10/03/22 2252  Chief Complaint(s) Palpitations  HPI Olivia Wagner is a 27 y.o. female who presents to the emergency departments for rapid palpitations and feeling like her throat was closing.  She initially believed this was an anxiety attack but when the symptoms did not resolve with breathing exercises after 10 to 15 minutes, she presented for evaluation.  She was concerned that this might be related to allergic reaction.  States that she is allergic to shellfish which she did not consume today.  She denied any associated rashes or angioedema.  She reports that she ate home-cooked vegetables.  She reports that this is has happened in the past, at times related to her menstrual cycle.  She denies any recent fevers or infections.  No coughing or congestion.  No chest pain.  No OCP use.  No prior DVT/PEs.  No recent travel or immobilization.  Patient reports that her symptoms completely resolved shortly after arriving to the emergency department.  Patient is currently on her menstrual cycle.  The history is provided by the patient.    Past Medical History History reviewed. No pertinent past medical history. There are no problems to display for this patient.  Home Medication(s) Prior to Admission medications   Medication Sig Start Date End Date Taking? Authorizing Provider  ibuprofen (ADVIL) 800 MG tablet Take 1 tablet (800 mg total) by mouth every 6 (six) hours as needed for moderate pain. 05/21/21   Gilda Crease, MD  lidocaine (XYLOCAINE) 2 % solution Use as directed 15 mLs in the mouth or throat every 6 (six) hours as needed for mouth pain. 08/07/21   Haskel Schroeder, PA-C  ondansetron (ZOFRAN ODT) 4 MG disintegrating tablet Take 1 tablet (4 mg total) by mouth every 8 (eight) hours as needed for nausea or vomiting. 01/15/21   Raspet, Noberto Retort, PA-C                                                                                                                                     Allergies Shellfish allergy  Review of Systems Review of Systems As noted in HPI  Physical Exam Vital Signs  I have reviewed the triage vital signs BP 111/67   Pulse 74   Temp 98.1 F (36.7 C)   Resp (!) 22   Ht 5' (1.524 m)   Wt 70.3 kg   LMP 09/28/2022   SpO2 100%   BMI 30.27 kg/m   Physical Exam Vitals reviewed.  Constitutional:      General: She is not in acute distress.    Appearance: She is well-developed. She is not diaphoretic.  HENT:     Head: Normocephalic and atraumatic.     Nose: Nose normal.     Mouth/Throat:     Mouth: No angioedema.     Pharynx: No uvula  swelling.  Eyes:     General: No scleral icterus.       Right eye: No discharge.        Left eye: No discharge.     Conjunctiva/sclera: Conjunctivae normal.     Pupils: Pupils are equal, round, and reactive to light.  Cardiovascular:     Rate and Rhythm: Normal rate and regular rhythm.     Heart sounds: No murmur heard.    No friction rub. No gallop.  Pulmonary:     Effort: Pulmonary effort is normal. No respiratory distress.     Breath sounds: Normal breath sounds. No stridor. No rales.  Abdominal:     General: There is no distension.     Palpations: Abdomen is soft.     Tenderness: There is no abdominal tenderness.  Musculoskeletal:        General: No tenderness.     Cervical back: Normal range of motion and neck supple.  Skin:    General: Skin is warm and dry.     Findings: No erythema or rash.  Neurological:     Mental Status: She is alert and oriented to person, place, and time.     ED Results and Treatments Labs (all labs ordered are listed, but only abnormal results are displayed) Labs Reviewed  CBC WITH DIFFERENTIAL/PLATELET - Abnormal; Notable for the following components:      Result Value   RBC 3.45 (*)    Hemoglobin 10.5 (*)    HCT 32.2 (*)    Platelets  402 (*)    All other components within normal limits  BASIC METABOLIC PANEL                                                                                                                         EKG  EKG Interpretation  Date/Time:  Saturday Oct 03 2022 23:00:03 EDT Ventricular Rate:  70 PR Interval:  132 QRS Duration: 91 QT Interval:  395 QTC Calculation: 427 R Axis:   83 Text Interpretation: Sinus rhythm Borderline repolarization abnormality Confirmed by Drema Pry [phone]) on 10/03/2022 11:47:17 PM       Radiology No results found.  Medications Ordered in ED Medications - No data to display  Procedures Procedures  (including critical care time)  Medical Decision Making / ED Course  Click here for ABCD2, HEART and other calculators  Medical Decision Making Amount and/or Complexity of Data Reviewed Labs: ordered.    This patient presents to the ED for: Palpitations and sensation of throat closing   Presentation involves an extensive number of treatment options, and is a complaint that carries with it a high risk of complications and morbidity. The differential diagnosis includes but not limited to:  Anxiety, tachydysrhythmia, bowel prolapse electrolyte derangement, severe anemia. Considered but feel these are less likely: PE, ACS, pericarditis, allergic reaction/anaphylaxis  Work up Interpretation and Management:  Cardiac Monitoring/EKG: EKG without acute ischemic changes, dysrhythmias or blocks.  Laboratory Tests ordered listed below with my independent interpretation:     Imaging Studies ordered listed below with my independent interpretation:   ED Course:   Clinical Course as of 10/04/22 0109  Sun Oct 04, 2022  0028 CBC without leukocytosis.  Patient has mild anemia with a hemoglobin of 10.5 which is down from 12 on prior  CBC 1 year ago.  Metabolic panel without significant electrolyte derangements or renal sufficiency [PC]  0029 Patient remained asymptomatic in the emergency department. [PC]  0109 No recurrence on reassessment [PC]    Clinical Course User Index [PC] Daymond Cordts, Amadeo Garnet, MD     Final Clinical Impression(s) / ED Diagnoses Final diagnoses:  Palpitations  Anemia, unspecified type   The patient appears reasonably screened and/or stabilized for discharge and I doubt any other medical condition or other [facility] requiring further screening, evaluation, or treatment in the ED at this time. I have discussed the findings, Dx and Tx plan with the patient/family who expressed understanding and agree(s) with the plan. Discharge instructions discussed at length. The patient/family was given strict return precautions who verbalized understanding of the instructions. No further questions at time of discharge.  Disposition: Discharge  Condition: Good  ED Discharge Orders     None        Follow Up: Primary care provider  Call  to schedule an appointment for close follow up           This chart was dictated using voice recognition software.  Despite best efforts to proofread,  errors can occur which can change the documentation meaning.      Nira Conn, MD 10/04/22 [phone]

## 2022-10-04 LAB — BASIC METABOLIC PANEL
Anion gap: 7 (ref 5–15)
BUN: 12 mg/dL (ref 6–20)
CO2: 26 mmol/L (ref 22–32)
Calcium: 8.9 mg/dL (ref 8.9–10.3)
Chloride: 105 mmol/L (ref 98–111)
Creatinine, Ser: 0.81 mg/dL (ref 0.44–1.00)
GFR, Estimated: >60 mL/min (ref >60)
Glucose, Bld: 78 mg/dL (ref 70–99)
Potassium: 4.1 mmol/L (ref 3.5–5.1)
Sodium: 138 mmol/L (ref 135–145)

## 2023-04-26 ENCOUNTER — Other Ambulatory Visit: Payer: Self-pay

## 2023-04-26 ENCOUNTER — Emergency Department (HOSPITAL_BASED_OUTPATIENT_CLINIC_OR_DEPARTMENT_OTHER)
Admission: EM | Admit: 2023-04-26 | Discharge: 2023-04-26 | Disposition: A | Discharge status: Home / Self Care | Payer: Medicaid Other | Attending: Emergency Medicine | Admitting: Emergency Medicine

## 2023-04-26 ENCOUNTER — Encounter (HOSPITAL_BASED_OUTPATIENT_CLINIC_OR_DEPARTMENT_OTHER): Payer: Self-pay | Admitting: *Deleted

## 2023-04-26 DIAGNOSIS — L03317 Cellulitis of buttock: Secondary | ICD-10-CM | POA: Insufficient documentation

## 2023-04-26 MED ORDER — CEPHALEXIN 250 MG PO CAPS
1000.0000 mg | ORAL_CAPSULE | Freq: Once | ORAL | Status: AC
  Administered 2023-04-26: 1000 mg via ORAL
  Filled 2023-04-26: qty 4

## 2023-04-26 MED ORDER — IBUPROFEN 800 MG PO TABS: 800.0000 mg | ORAL_TABLET | Freq: Three times a day (TID) | ORAL | 0 refills | Status: AC

## 2023-04-26 MED ORDER — IBUPROFEN 800 MG PO TABS
800.0000 mg | ORAL_TABLET | Freq: Once | ORAL | Status: AC
  Administered 2023-04-26: 800 mg via ORAL
  Filled 2023-04-26: qty 1

## 2023-04-26 MED ORDER — ACETAMINOPHEN 325 MG PO TABS: 650.0000 mg | ORAL_TABLET | Freq: Four times a day (QID) | ORAL | 0 refills | Status: AC | PRN

## 2023-04-26 MED ORDER — CEPHALEXIN 500 MG PO CAPS: 500.0000 mg | ORAL_CAPSULE | Freq: Four times a day (QID) | ORAL | 0 refills | Status: AC

## 2023-04-26 NOTE — Discharge Instructions (Signed)
You have an area of infected skin called cellulitis.  I have started you on antibiotics.  These antibiotics do not work instantaneously.   - You should allow at least 48 hours after your first dose for the cellulitis to slow it's spreading.   - You should allow 48 hours after the first dose to see the cellulitis to start improving.   - These things may happen quicker than that but allow at least these limitations prior to returning to the emergency department or your primary doctor.   - The exceptions to this are if you become systemically ill such as having a fever, nausea, vomiting, malaise.  - The other exception is if you have a rapid spreading of the cellulitis or the redness and swelling and pain.  If you notice more than a couple inches of spreading over a couple hours you need to return to the emergency department immediately because this could be a life-threatening infection. If you notice red streaking from the site you also need to return for reevaluation.  - If not, follow up with your primary doctor as instructed for reevaluation.    Abscesses and cellulitis are often caused by bacteria that live on your skin naturally all the time.  One way to get rid of this is to put a cup of bleach in a bathtub of water soaking for 10 minutes a day for a week.  If you do this, realize the bleach will stain your clothes, towels, carpets, rugs and anything else with die.  It will also make your feet slippery when used about a tub to be careful not to fall.  Another thing that works very well is chlorhexidine washes.  You can buy chlorhexidine wipes at the store and cleanse your whole body with them once a day for 7 days along with putting mupirocin ointment in your nose and finding chlorhexidine mouthwash to rinse your mouth with twice a day.

## 2023-04-26 NOTE — ED Provider Notes (Signed)
[place] EMERGENCY DEPARTMENT AT [facility] Provider Note   CSN: [id number] Arrival date & time: 04/26/23  0413     History  Chief Complaint  Patient presents with   Abscess    Olivia Wagner is a 27 y.o. female.  27 year old female presents ER today with pain to her left buttock and gluteal cleft.  States she has had draining abscesses in the area before and is concerned that is what is going on this time however she did not notice any whitehead or any abnormal skin to suggest the ability to drain it at home so she presents here for further evaluation.  No fevers.  States she gets these every couple months usually in association with a menstrual cycle.  This is been going on for the last few years and she is always just attributed to hormone changes.  No fever, nausea, vomiting, malaise or other associate symptoms.   Abscess      Home Medications Prior to Admission medications   Medication Sig Start Date End Date Taking? Authorizing Provider  acetaminophen (TYLENOL) 325 MG tablet Take 2 tablets (650 mg total) by mouth every 6 (six) hours as needed. 04/26/23  Yes Migel Hannis, Barbara Cower, MD  cephALEXin (KEFLEX) 500 MG capsule Take 1 capsule (500 mg total) by mouth 4 (four) times daily. 04/26/23  Yes Tino Ronan, Barbara Cower, MD  ibuprofen (ADVIL) 800 MG tablet Take 1 tablet (800 mg total) by mouth 3 (three) times daily. 04/26/23  Yes Iren Whipp, Barbara Cower, MD      Allergies    Shellfish allergy    Review of Systems   Review of Systems  Physical Exam Updated Vital Signs BP 120/72   Pulse 91   Temp 97.9 F (36.6 C) (Oral)   LMP 04/22/2023 (Approximate)   SpO2 100%  Physical Exam Vitals and nursing note reviewed.  Constitutional:      Appearance: She is well-developed.  HENT:     Head: Normocephalic and atraumatic.  Cardiovascular:     Rate and Rhythm: Normal rate and regular rhythm.  Pulmonary:     Effort: No respiratory distress.     Breath sounds: No stridor.   Abdominal:     General: There is no distension.  Genitourinary:    Comments: Chaperoned by nurse Victorino Dike, has approximately 3-4 cm of indurated, warm, erythematous, tender skin left side of gluteal cleft.  No fluctuance or evidence of abscess. Musculoskeletal:     Cervical back: Normal range of motion.  Neurological:     Mental Status: She is alert.   ED Results / Procedures / Treatments   Labs (all labs ordered are listed, but only abnormal results are displayed) Labs Reviewed - No data to display  EKG None  Radiology No results found.  Procedures Procedures    Medications Ordered in ED Medications  cephALEXin (KEFLEX) capsule 1,000 mg (1,000 mg Oral Given 04/26/23 0517)  ibuprofen (ADVIL) tablet 800 mg (800 mg Oral Given 04/26/23 1308)    ED Course/ Medical Decision Making/ A&P                                 Medical Decision Making No evidence of abscess needing drainage at this time.  Appears to be simple cellulitis however she has these been so often that I suspect she is colonized.  She has had purulent drainage from other ones in the past so we will suggest chlorhexidine washes and use Keflex to  treat the current infection.  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  Risk OTC drugs. Prescription drug management.    Final Clinical Impression(s) / ED Diagnoses Final diagnoses:  Cellulitis of buttock    Rx / DC Orders ED Discharge Orders          Ordered    cephALEXin (KEFLEX) 500 MG capsule  4 times daily        04/26/23 0511    ibuprofen (ADVIL) 800 MG tablet  3 times daily        04/26/23 0511    acetaminophen (TYLENOL) 325 MG tablet  Every 6 hours PRN        04/26/23 0511              Mya Suell, Barbara Cower, MD 04/26/23 [phone]

## 2023-04-26 NOTE — ED Triage Notes (Addendum)
C/o "cyst" to her mid inner buttocks area for the past week. Has had this once before, but it drained itself. Denies any fevers. C/o of chills. Pt states she put "crushed aspirin" on the area.

## 2023-04-27 ENCOUNTER — Other Ambulatory Visit (HOSPITAL_BASED_OUTPATIENT_CLINIC_OR_DEPARTMENT_OTHER): Payer: Self-pay

## 2023-04-27 ENCOUNTER — Other Ambulatory Visit: Payer: Self-pay

## 2023-04-27 ENCOUNTER — Encounter (HOSPITAL_BASED_OUTPATIENT_CLINIC_OR_DEPARTMENT_OTHER): Payer: Self-pay | Admitting: Emergency Medicine

## 2023-04-27 ENCOUNTER — Emergency Department (HOSPITAL_BASED_OUTPATIENT_CLINIC_OR_DEPARTMENT_OTHER)
Admission: EM | Admit: 2023-04-27 | Discharge: 2023-04-27 | Disposition: A | Discharge status: Home / Self Care | Payer: Medicaid Other | Attending: Emergency Medicine | Admitting: Emergency Medicine

## 2023-04-27 DIAGNOSIS — L0231 Cutaneous abscess of buttock: Secondary | ICD-10-CM | POA: Insufficient documentation

## 2023-04-27 DIAGNOSIS — Z79899 Other long term (current) drug therapy: Secondary | ICD-10-CM | POA: Insufficient documentation

## 2023-04-27 DIAGNOSIS — M7989 Other specified soft tissue disorders: Secondary | ICD-10-CM | POA: Diagnosis present

## 2023-04-27 IMAGING — CT CT HEAD W/O CM
4 series · 16 of 47 positions shown, 18 images · non-contrast
Comparison: 09/02/2020

CLINICAL DATA: Vision loss, monocular left hemianopia in the upper
field



[Series 2: head wo · axial · 0.44mm/px · z∈[+1182,+1302]mm · 7 of 32 slices shown, 9 images]
[im 4/32  brain]
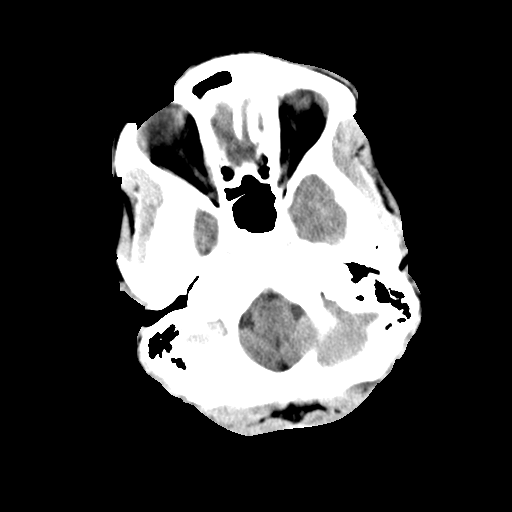
[im 4/32  bone]
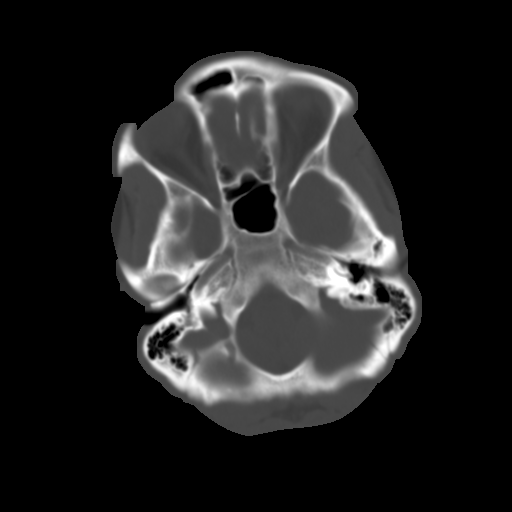
[im 8/32  brain]
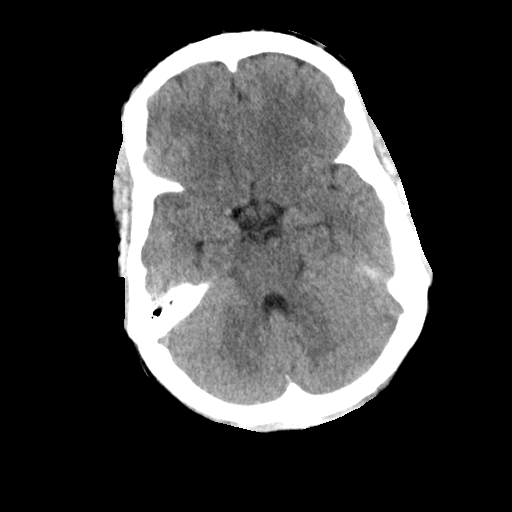
[im 12/32  brain]
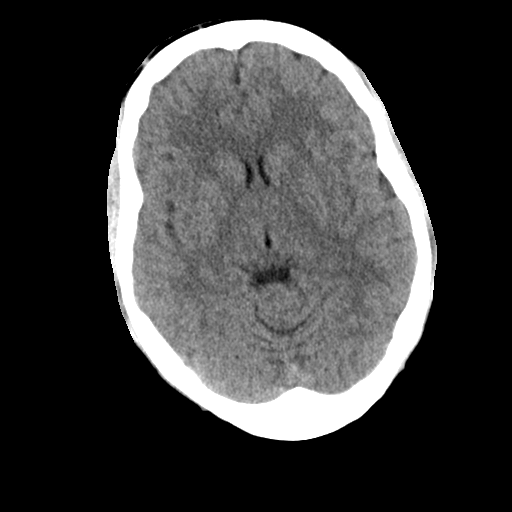
[im 16/32  brain]
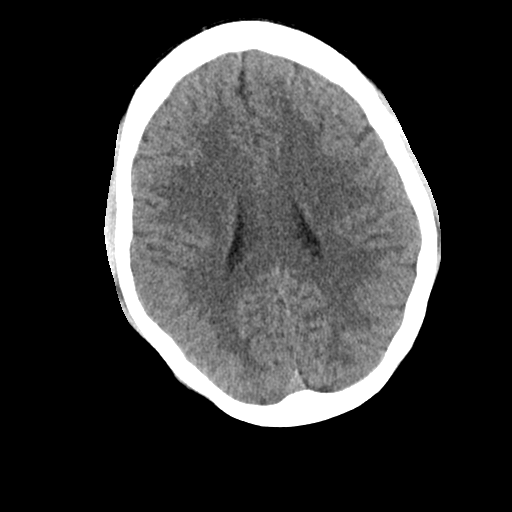
[im 20/32  brain]
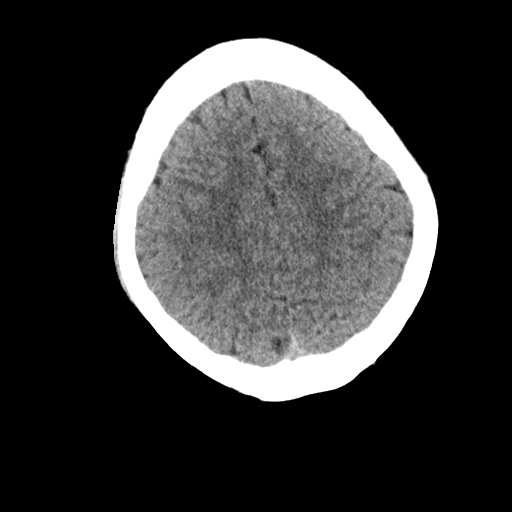
[im 20/32  bone]
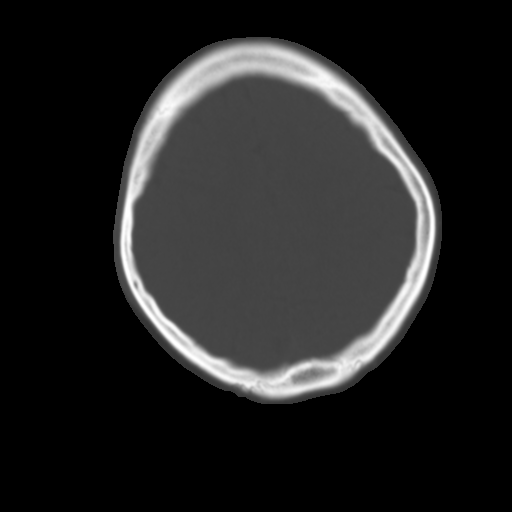
[im 24/32  brain]
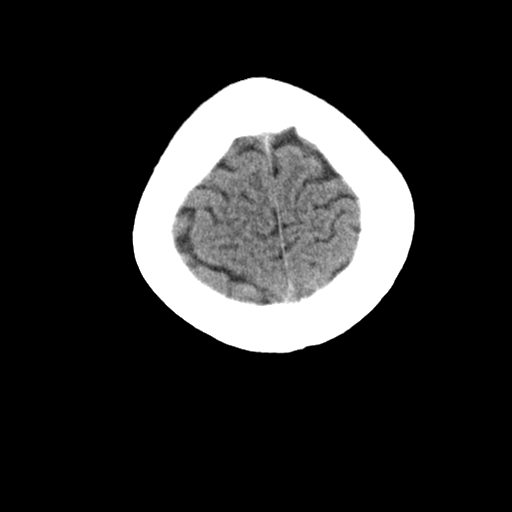
[im 28/32  brain]
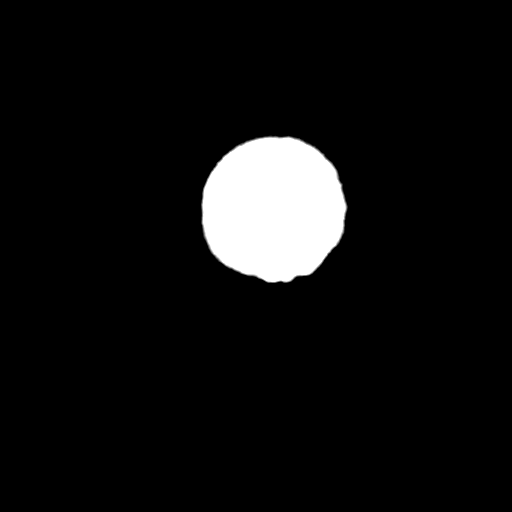

[Series 3: head bone · axial · 0.44mm/px · z∈[+1181,+1213]mm · 3 of 80 slices shown]
[im 8/80  bone]
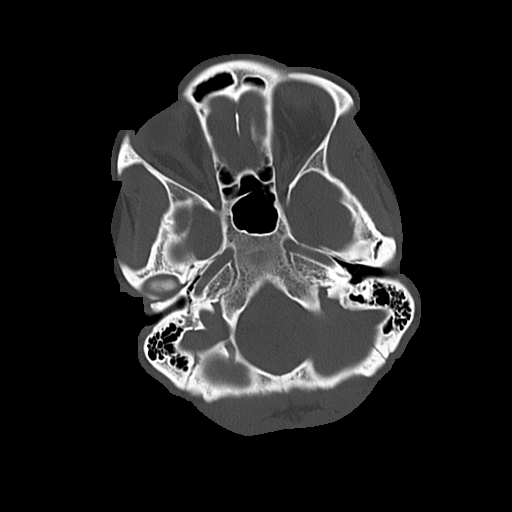
[im 16/80  bone]
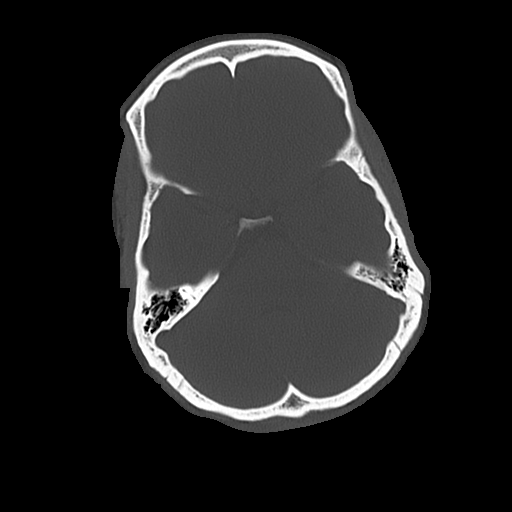
[im 24/80  bone]
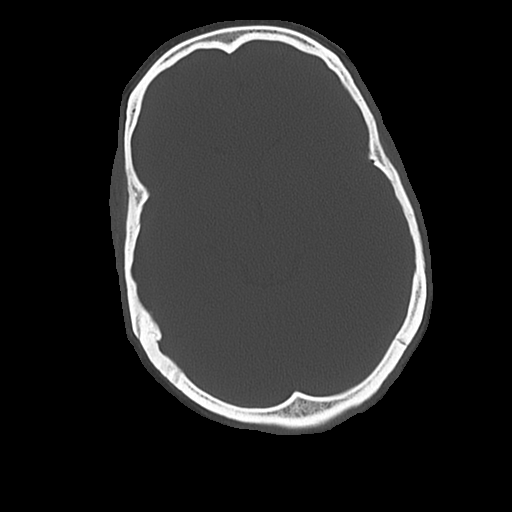

[Series 4: coronal soft · coronal · 0.29mm/px · 3 of 67 slices shown]
[im 24/67  brain]
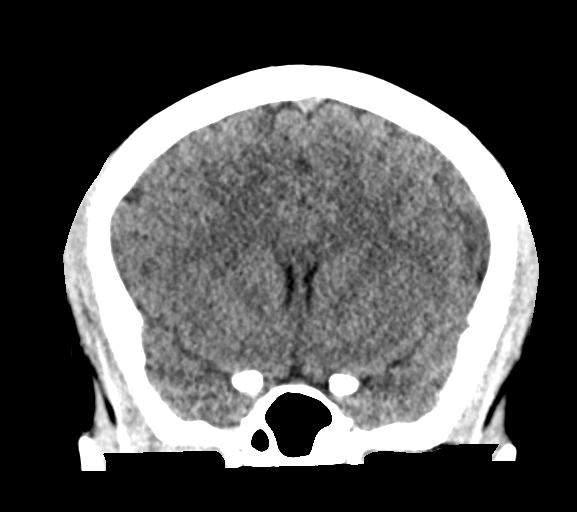
[im 30/67  brain]
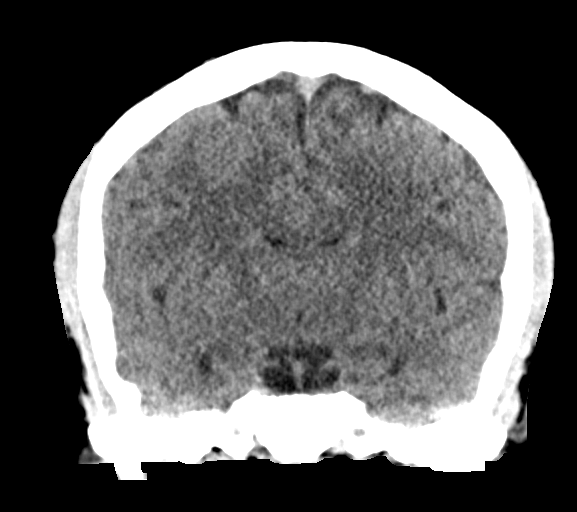
[im 37/67  brain]
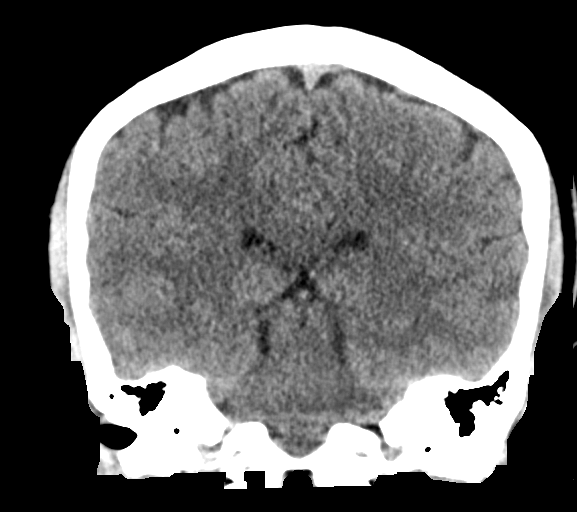

[Series 5: sagittal soft · sagittal · 0.29mm/px · 3 of 54 slices shown]
[im 18/54  brain]
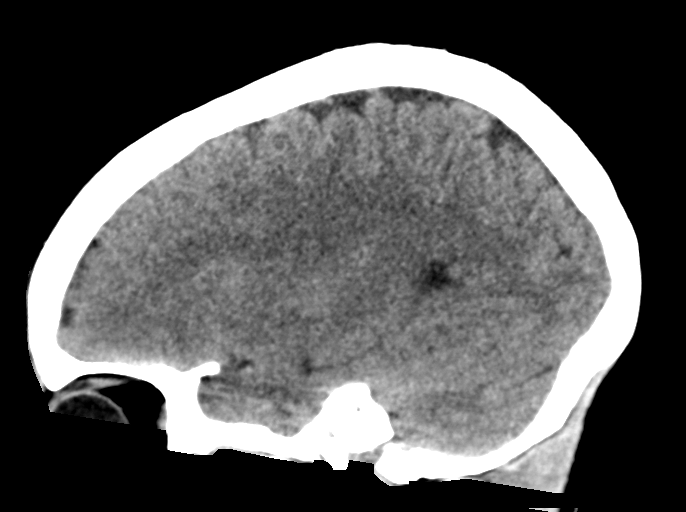
[im 27/54  brain]
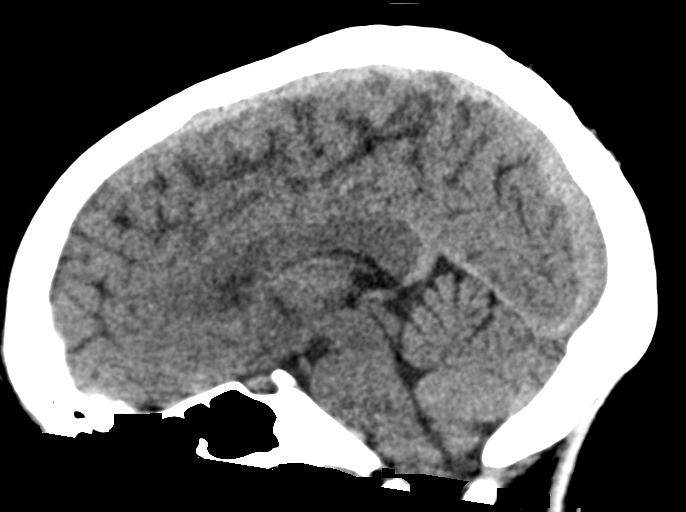
[im 36/54  brain]
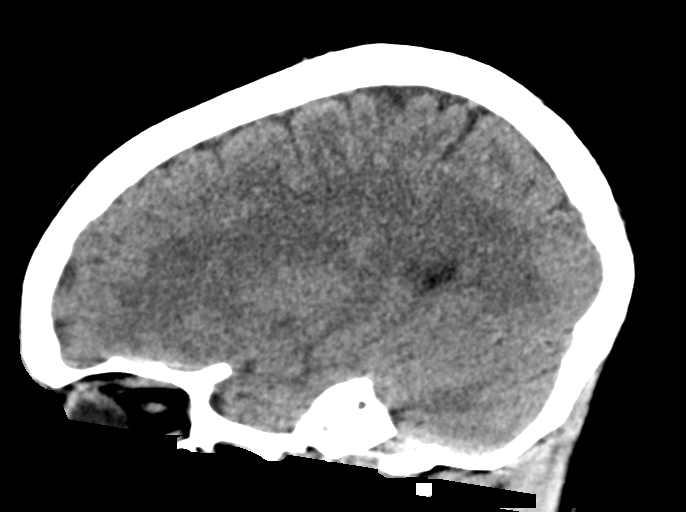

[16 of 47 positions shown; findings below may reference images not displayed]

FINDINGS: Brain: No evidence of acute infarction, hemorrhage, hydrocephalus,
extra-axial collection or mass lesion/mass effect.

Vascular: No hyperdense vessel or unexpected calcification.

Skull: Normal. Negative for fracture or focal lesion.

Sinuses/Orbits: No acute finding.

Other: None.
IMPRESSION: No acute intracranial abnormality.

## 2023-04-27 MED ORDER — LIDOCAINE HCL 2 % IJ SOLN
10.0000 mL | Freq: Once | INTRAMUSCULAR | Status: AC
  Administered 2023-04-27: 200 mg
  Filled 2023-04-27: qty 20

## 2023-04-27 MED ORDER — HYDROCODONE-ACETAMINOPHEN 5-325 MG PO TABS
1.0000 | ORAL_TABLET | ORAL | 0 refills | Status: AC | PRN
  Filled 2023-04-27: qty 10, 2d supply, fill #0

## 2023-04-27 NOTE — ED Triage Notes (Signed)
Pt c/o cellulitis on LT side buttocks, recent dx yesterday. Reports pain has increased. Took ibuprofen at 1340 with no relief

## 2023-04-27 NOTE — Discharge Instructions (Signed)
Return if any problems.

## 2023-04-27 NOTE — ED Provider Notes (Signed)
[place] EMERGENCY DEPARTMENT AT [facility] Provider Note   CSN: [id number] Arrival date & time: 04/27/23  1503     History  Chief Complaint  Patient presents with   Cellulitis    Olivia Wagner is a 27 y.o. female.  Patient complains of an abscess on her left buttock.  Patient reports she was seen here yesterday.  Patient was started on Keflex.  Patient reports increased swelling and pain.  Patient denies any fever or chills.  Patient states that she has had similar in the past and had to have areas drained.  Patient reports no relief with ibuprofen.  Patient denies any diabetes she does not have any medical problems.  Patient is allergic to seafood.        Home Medications Prior to Admission medications   Medication Sig Start Date End Date Taking? Authorizing Provider  HYDROcodone-acetaminophen (NORCO/VICODIN) 5-325 MG tablet Take 1 tablet by mouth every 4 (four) hours as needed for moderate pain (pain score 4-6). 04/27/23 04/26/24 Yes Elson Areas, PA-C  acetaminophen (TYLENOL) 325 MG tablet Take 2 tablets (650 mg total) by mouth every 6 (six) hours as needed. 04/26/23   Mesner, Barbara Cower, MD  cephALEXin (KEFLEX) 500 MG capsule Take 1 capsule (500 mg total) by mouth 4 (four) times daily. 04/26/23   Mesner, Barbara Cower, MD  ibuprofen (ADVIL) 800 MG tablet Take 1 tablet (800 mg total) by mouth 3 (three) times daily. 04/26/23   Mesner, Barbara Cower, MD      Allergies    Shellfish allergy    Review of Systems   Review of Systems  Skin:  Positive for wound.  All other systems reviewed and are negative.   Physical Exam Updated Vital Signs BP 128/75   Pulse 85   Temp 98.4 F (36.9 C)   Resp 20   LMP 04/22/2023 (Approximate)   SpO2 98%  Physical Exam Vitals and nursing note reviewed.  Constitutional:      Appearance: She is well-developed.  HENT:     Head: Normocephalic.  Cardiovascular:     Rate and Rhythm: Normal rate.  Pulmonary:     Effort: Pulmonary  effort is normal.  Abdominal:     General: There is no distension.  Musculoskeletal:        General: Normal range of motion.     Comments: Swollen tender area left buttock, small pointing area,  Skin:    General: Skin is warm.  Neurological:     General: No focal deficit present.     Mental Status: She is alert and oriented to person, place, and time.     ED Results / Procedures / Treatments   Labs (all labs ordered are listed, but only abnormal results are displayed) Labs Reviewed - No data to display  EKG None  Radiology No results found.  Procedures .Incision and Drainage  Date/Time: 04/27/2023 5:17 PM  Performed by: Elson Areas, PA-C Authorized by: Elson Areas, PA-C   Consent:    Consent obtained:  Verbal   Consent given by:  Patient   Risks, benefits, and alternatives were discussed: yes     Risks discussed:  Bleeding   Alternatives discussed:  No treatment Universal protocol:    Procedure explained and questions answered to patient or proxy's satisfaction: no     Patient identity confirmed:  Verbally with patient Location:    Type:  Abscess   Size:  1.5   Location:  Lower extremity   Lower extremity location:  Buttock Pre-procedure details:    Skin preparation:  Antiseptic wash Sedation:    Sedation type:  None Anesthesia:    Anesthesia method:  Local infiltration   Local anesthetic:  Lidocaine 1% w/o epi Procedure type:    Complexity:  Simple Procedure details:    Incision types:  Single straight   Wound management:  Probed and deloculated and irrigated with saline   Drainage:  Purulent   Drainage amount:  Moderate   Wound treatment:  Wound left open Post-procedure details:    Procedure completion:  Tolerated well, no immediate complications     Medications Ordered in ED Medications  lidocaine (XYLOCAINE) 2 % (with pres) injection 200 mg (200 mg Infiltration Given by Other 04/27/23 1704)    ED Course/ Medical Decision Making/ A&P                                  Medical Decision Making Patient complains of an abscess on left buttock.  Risk Prescription drug management. Risk Details: I&D to abscess.  Patient is given a prescription for hydrocodone.  She is advised to continue Keflex return if any problems.           Final Clinical Impression(s) / ED Diagnoses Final diagnoses:  Abscess of buttock, left    Rx / DC Orders ED Discharge Orders          Ordered    HYDROcodone-acetaminophen (NORCO/VICODIN) 5-325 MG tablet  Every 4 hours PRN        04/27/23 1708           An After Visit Summary was printed and given to the patient.    Elson Areas, PA-C 04/27/23 1718    Gwyneth Sprout, MD 04/29/23 2036

## 2023-05-07 ENCOUNTER — Other Ambulatory Visit (HOSPITAL_BASED_OUTPATIENT_CLINIC_OR_DEPARTMENT_OTHER): Payer: Self-pay

## 5886-02-15 DEATH — deceased
